# Patient Record
Sex: Female | Born: 1985 | Race: Black or African American | Hispanic: No | Marital: Single | Smoking: Never smoker
Health system: Southern US, Community
[De-identification: ages and names within clinical notes are randomized; demographics above are authoritative.]

## PROBLEM LIST (undated history)

## (undated) DIAGNOSIS — T7840XA Allergy, unspecified, initial encounter: Secondary | ICD-10-CM

## (undated) DIAGNOSIS — Z789 Other specified health status: Secondary | ICD-10-CM

## (undated) HISTORY — DX: Allergy, unspecified, initial encounter: T78.40XA

## (undated) HISTORY — PX: NO PAST SURGERIES: SHX2092

---
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
## 2008-04-13 ENCOUNTER — Inpatient Hospital Stay (HOSPITAL_COMMUNITY): Admission: AD | Admit: 2008-04-13 | Discharge: 2008-04-14 | Payer: Self-pay | Admitting: Obstetrics and Gynecology

## 2008-04-14 ENCOUNTER — Encounter: Payer: Self-pay | Admitting: Obstetrics & Gynecology

## 2008-12-21 ENCOUNTER — Inpatient Hospital Stay (HOSPITAL_COMMUNITY): Admission: AD | Admit: 2008-12-21 | Discharge: 2008-12-22 | Payer: Self-pay | Admitting: Obstetrics & Gynecology

## 2009-03-06 IMAGING — US US TRANSVAGINAL NON-OB
1 series · 14 of 25 positions shown · non-contrast
Comparison: None

CLINICAL DATA: Abdominal pain

TRANSABDOMINAL AND TRANSVAGINAL PELVIC ULTRASOUND
TECHNIQUE: Both transabdominal and transvaginal ultrasound examinations of the
pelvis were performed including evaluation of the uterus, ovaries, adnexal
regions, and pelvic cul-de-sac.

[Series 1: us transvaginal non-ob · 0.24mm/px · 14 of 42 slices shown]
[im 1/42]
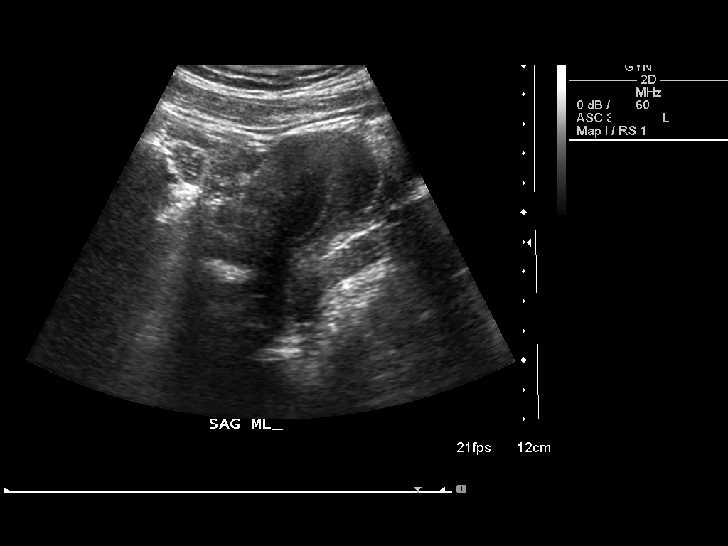
[im 4/42]
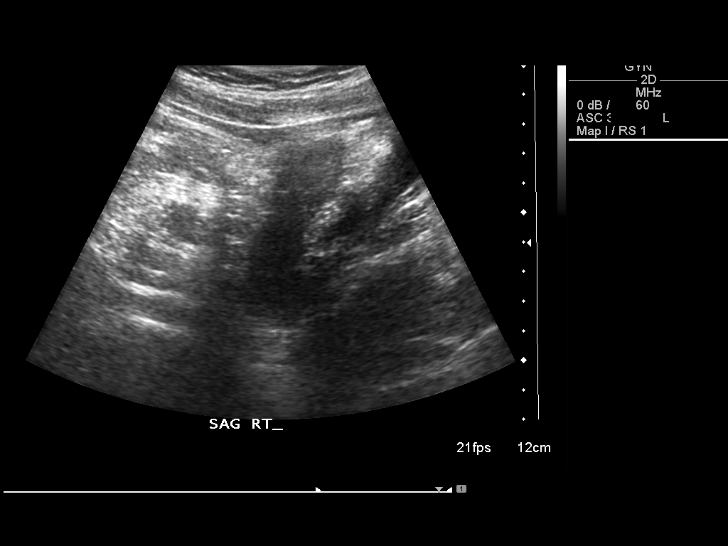
[im 7/42]
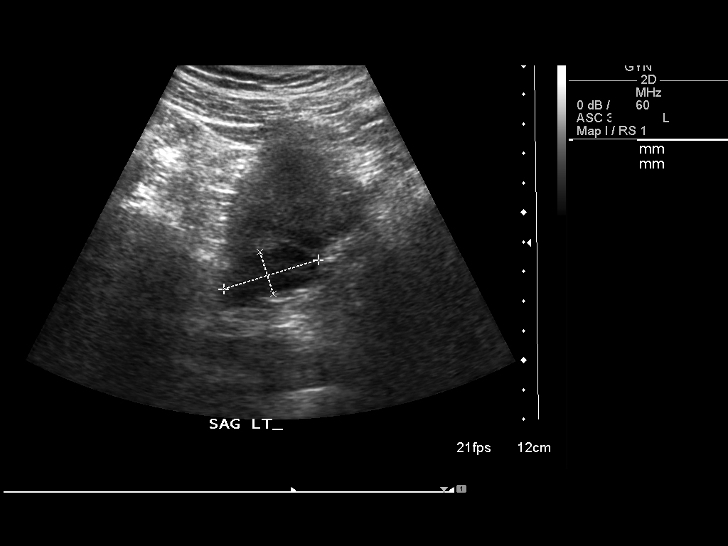
[im 11/42]
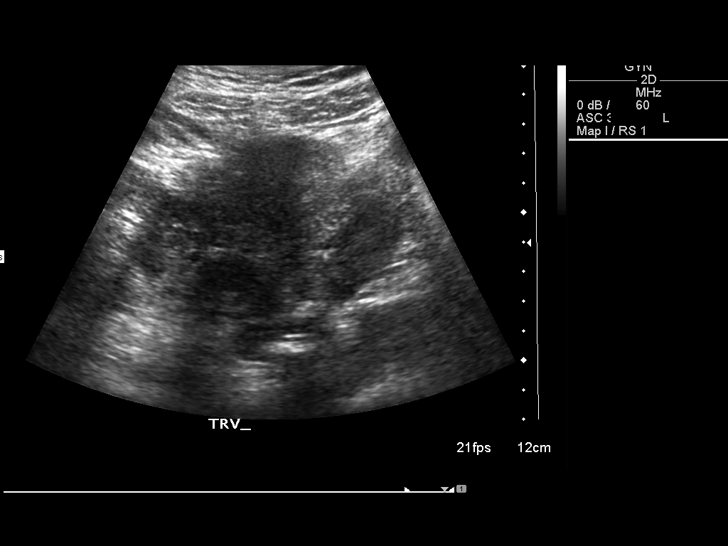
[im 14/42]
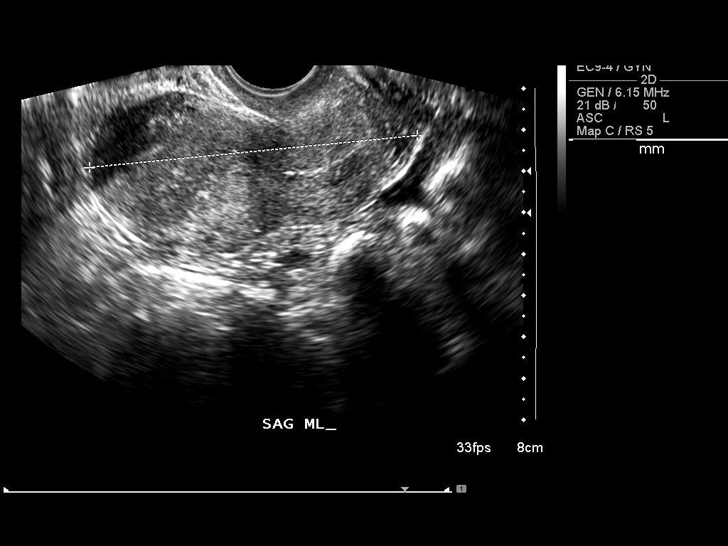
[im 16/42]
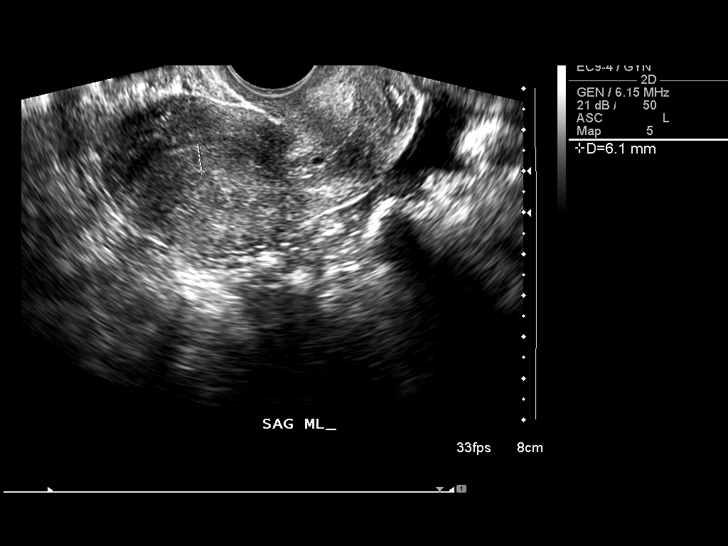
[im 19/42]
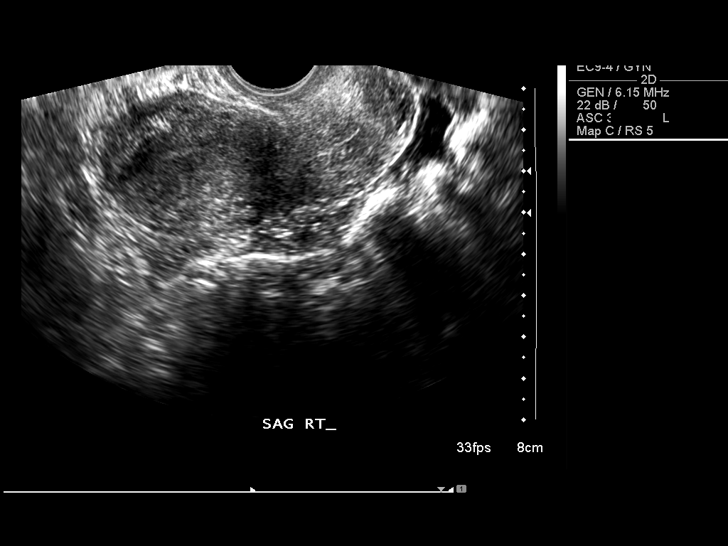
[im 23/42]
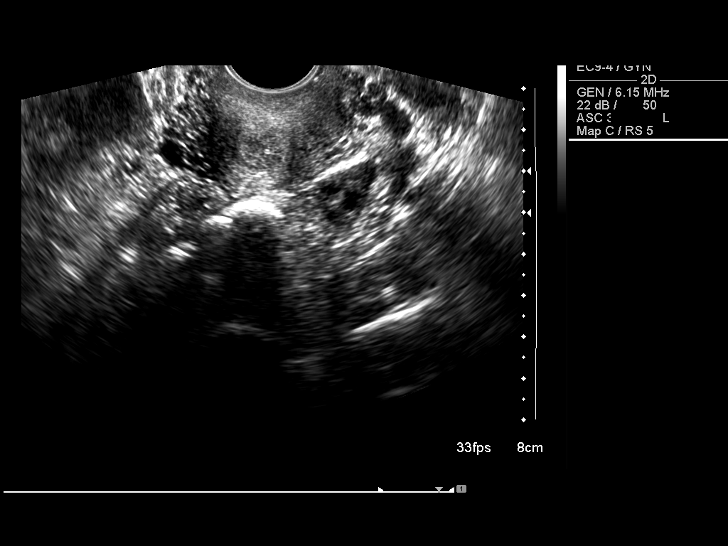
[im 26/42]
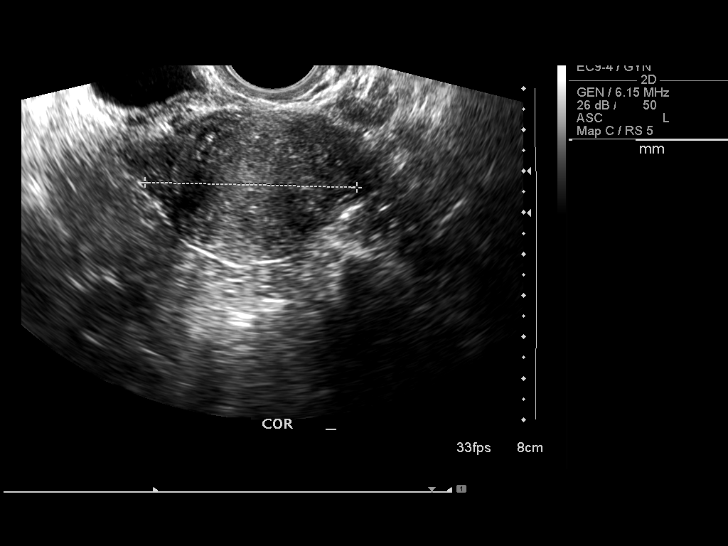
[im 28/42]
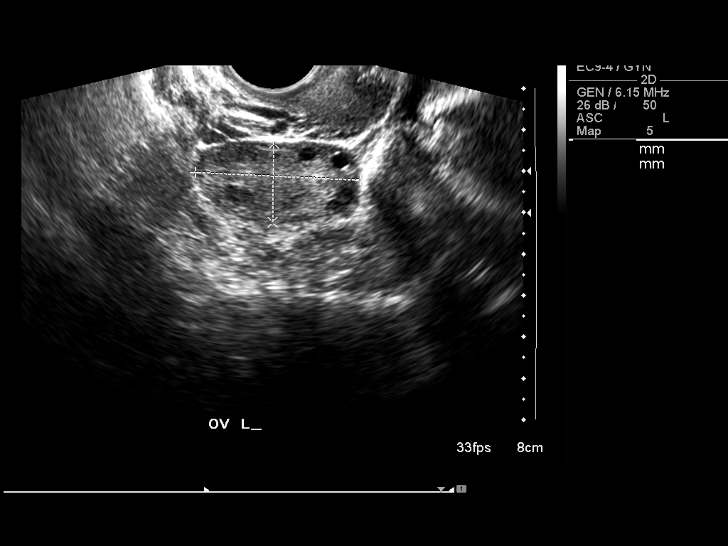
[im 31/42]
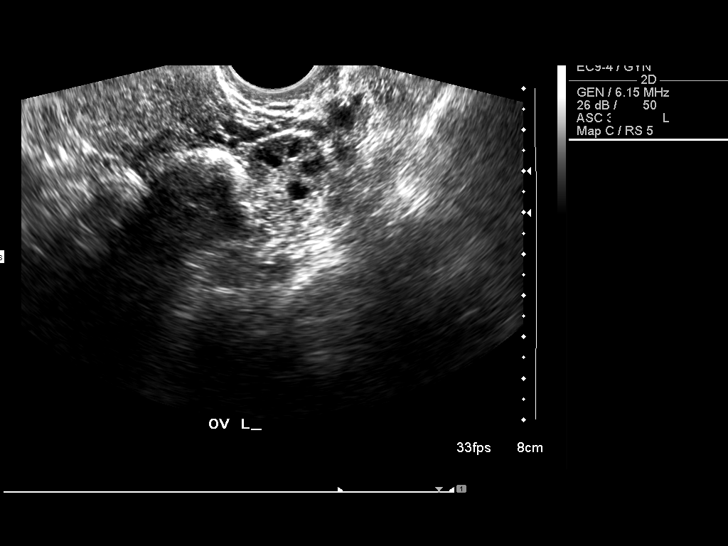
[im 35/42]
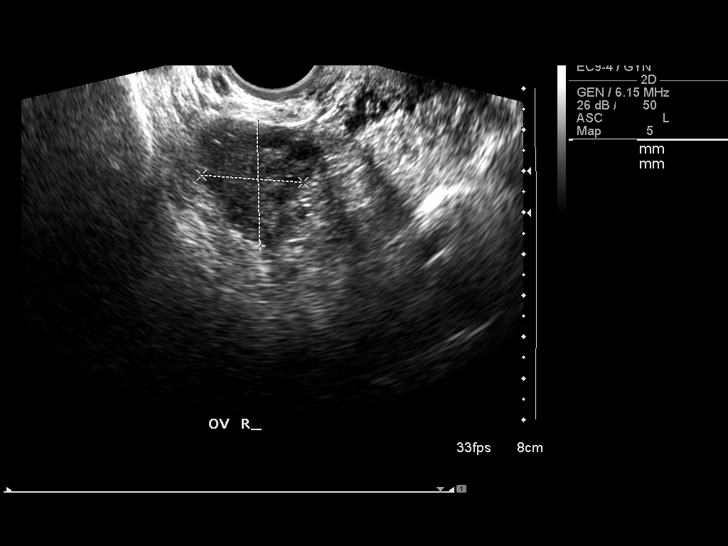
[im 38/42]
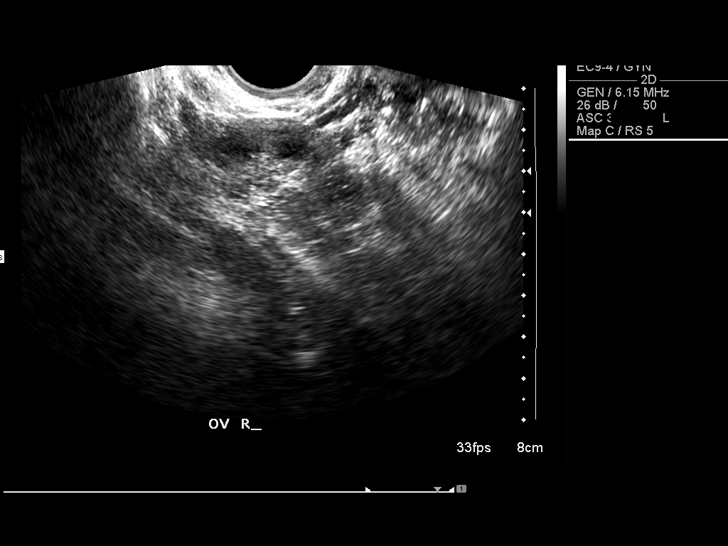
[im 42/42]
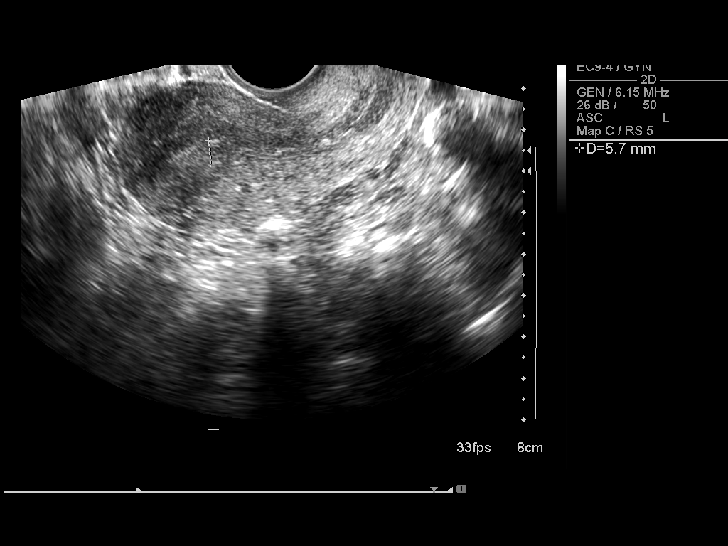

[14 of 25 positions shown; findings below may reference images not displayed]

FINDINGS: Uterine length is 7.9 cm. There is a small amount of free pelvic fluid
in the cul-de-sac.

Left ovary measures 4.0 x 1.9 x 2.0 cm and contains normal appearing follicles.
The right ovary measures 3.0 x 2.5 x 2.7 cm and likewise contains normal
appearing follicles.

The endometrial stripe measures 6 mm in thickness. The cervix appears normal.

IMPRESSION

1. Small amount of free pelvic fluid in the cul-de-sac. The uterus and ovaries
appear normal.

## 2009-06-19 ENCOUNTER — Emergency Department (HOSPITAL_COMMUNITY): Admission: EM | Admit: 2009-06-19 | Discharge: 2009-06-19 | Payer: Self-pay | Admitting: Family Medicine

## 2010-06-26 LAB — WET PREP, GENITAL: Trich, Wet Prep: NONE SEEN

## 2010-06-26 LAB — URINE MICROSCOPIC-ADD ON

## 2010-06-26 LAB — GC/CHLAMYDIA PROBE AMP, GENITAL: GC Probe Amp, Genital: NEGATIVE

## 2010-06-26 LAB — URINALYSIS, ROUTINE W REFLEX MICROSCOPIC
Bilirubin Urine: NEGATIVE
Glucose, UA: NEGATIVE mg/dL
Ketones, ur: NEGATIVE mg/dL
pH: 8.5 — ABNORMAL HIGH (ref 5.0–8.0)

## 2010-07-04 IMAGING — US US OB COMP LESS 14 WK
1 series · 13 of 27 positions shown · non-contrast
Comparison: None applicable.

CLINICAL DATA: 22-year-old female with cramping and heavy vaginal
bleeding.  Quantitative beta HCG of 2174.

OBSTETRIC <14 WK ULTRASOUND
TECHNIQUE: Transabdominal ultrasound was performed for evaluation
of the gestation as well as the maternal uterus and adnexal
regions.

[Series 1: us ob comp less 14 wks · 13 of 27 slices shown]
[im 2/27]
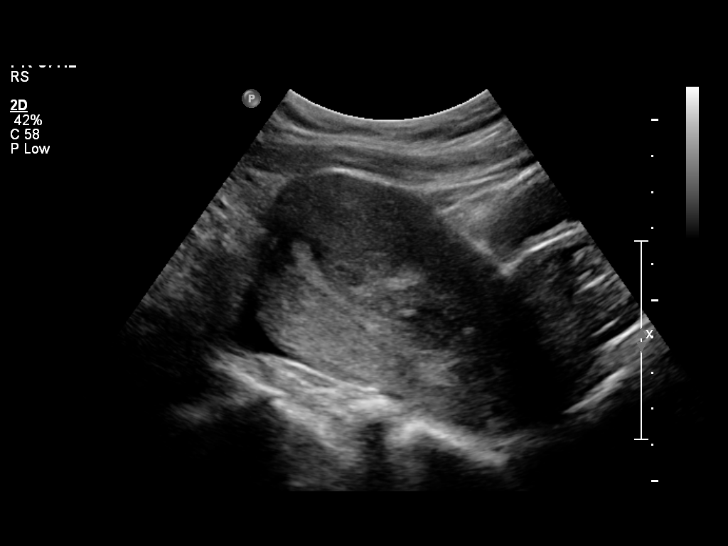
[im 4/27]
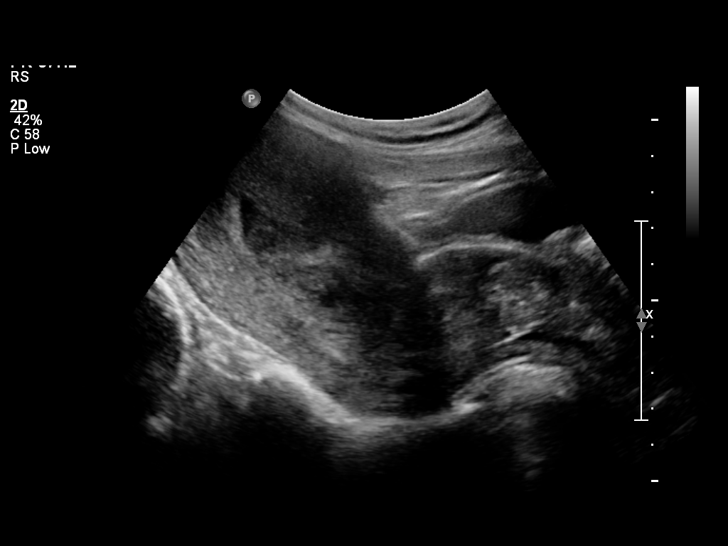
[im 6/27]
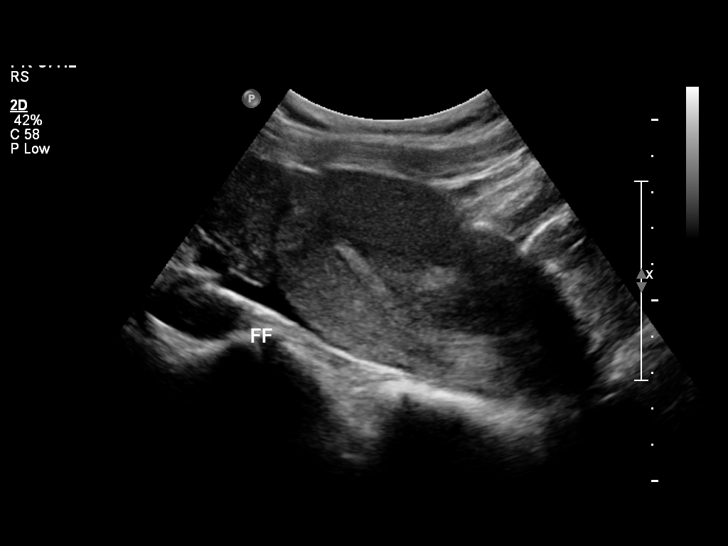
[im 8/27]
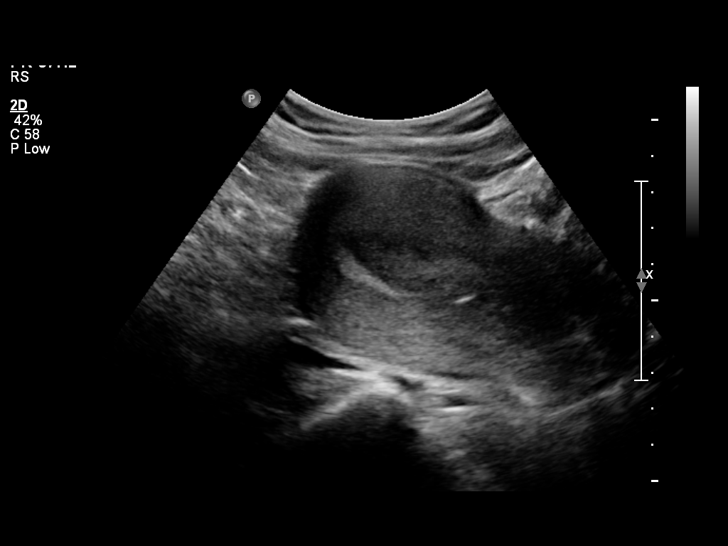
[im 10/27]
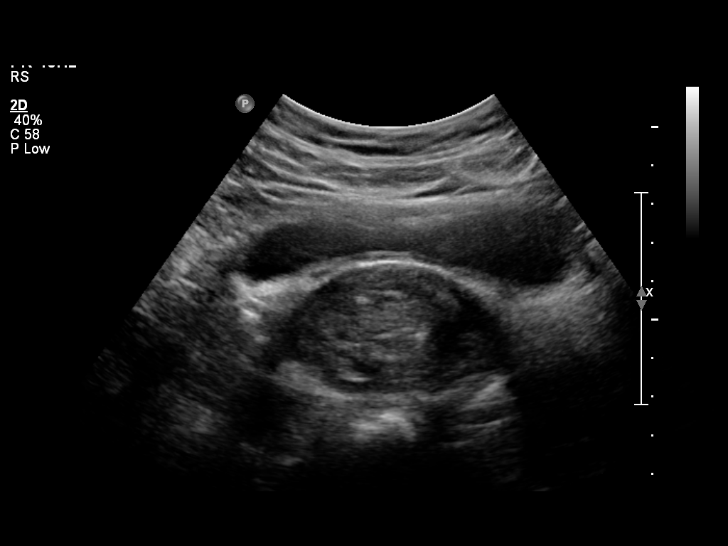
[im 12/27]
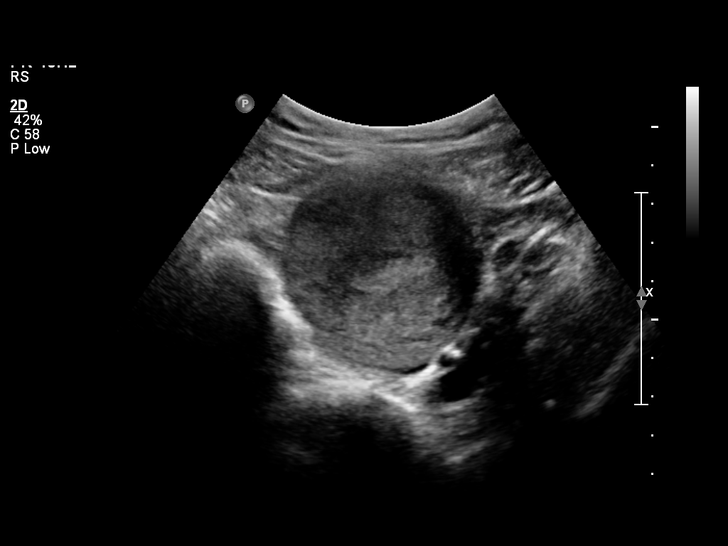
[im 14/27]
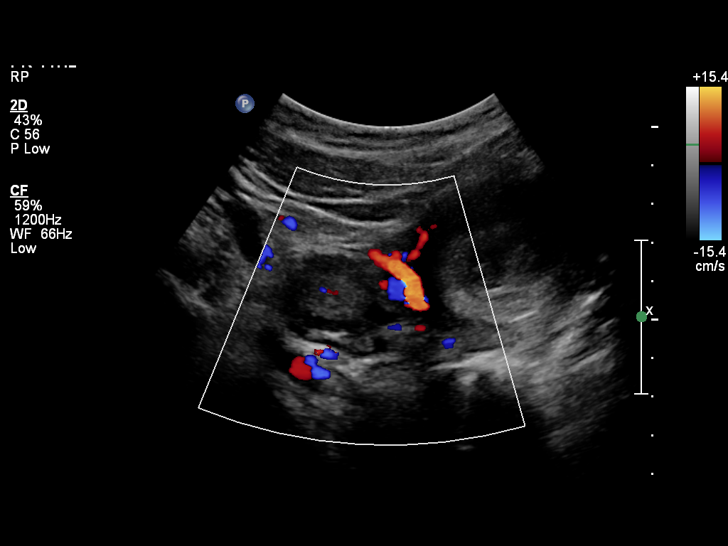
[im 16/27]
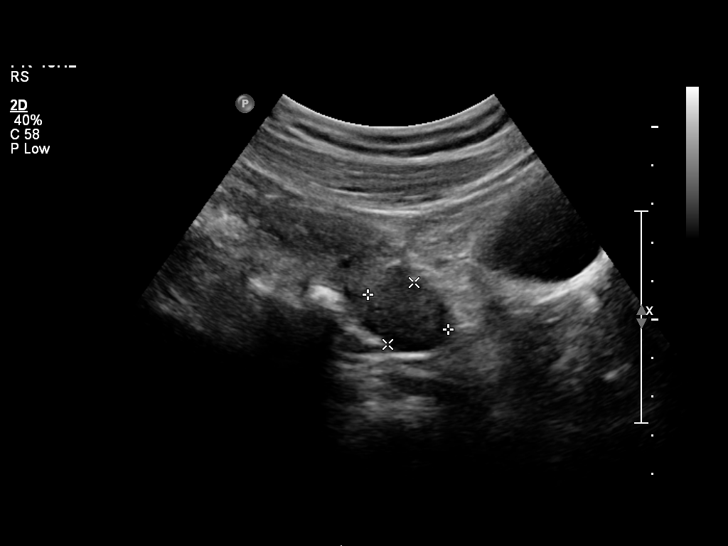
[im 18/27]
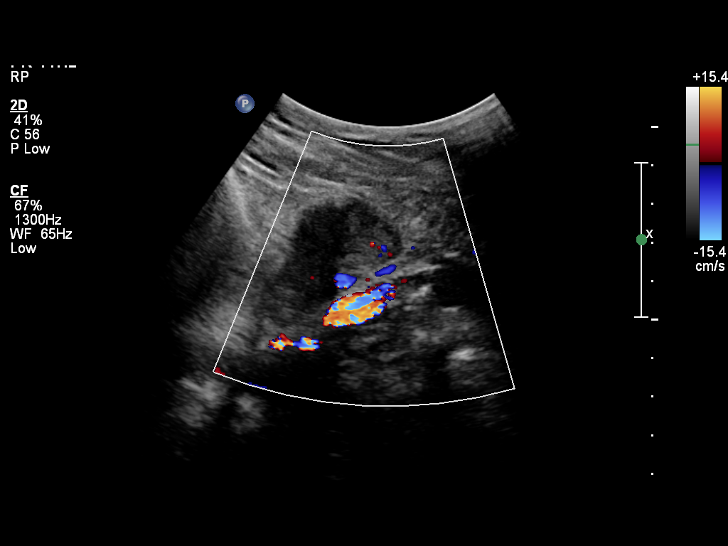
[im 20/27]
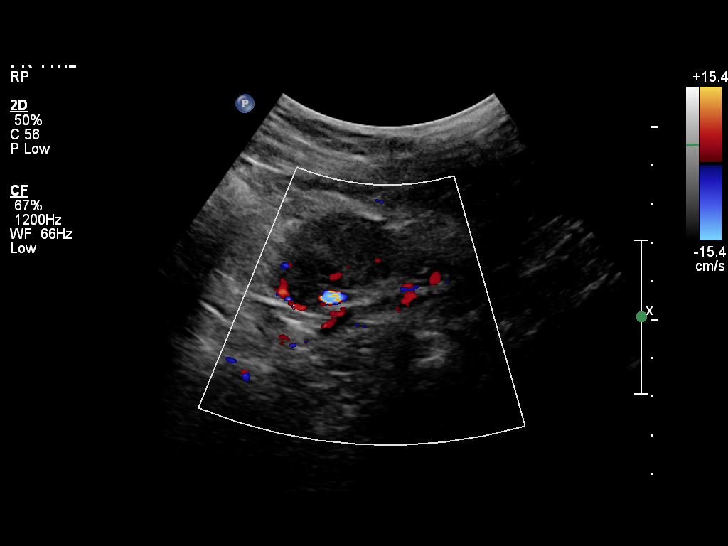
[im 22/27]
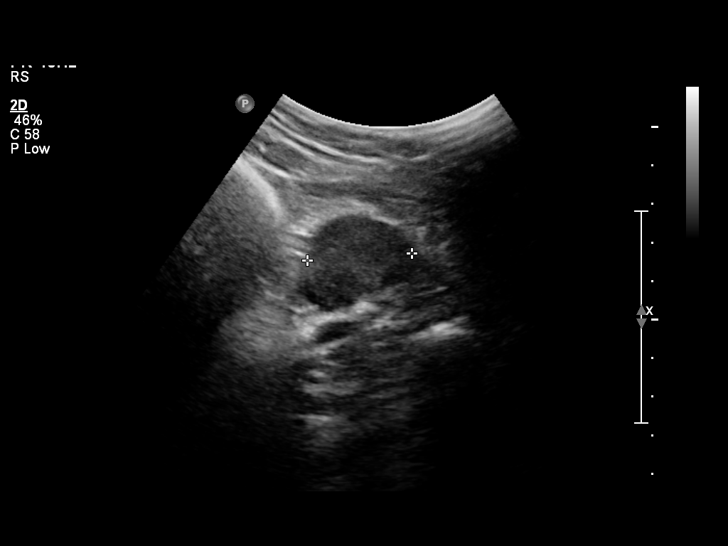
[im 24/27]
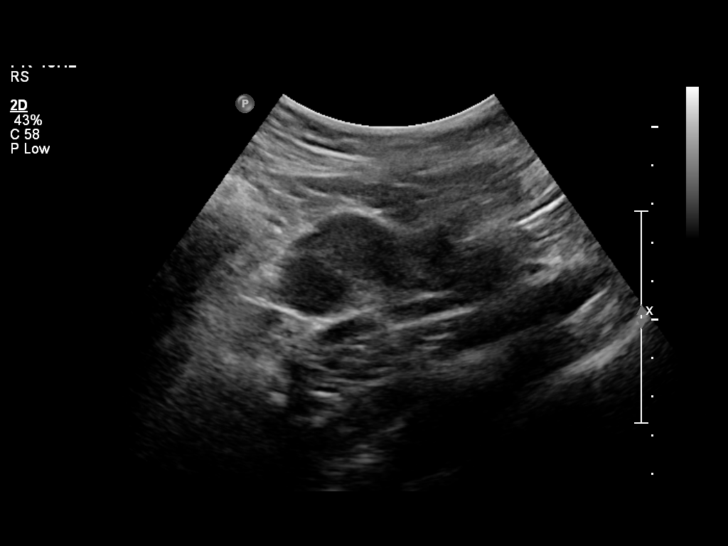
[im 26/27]
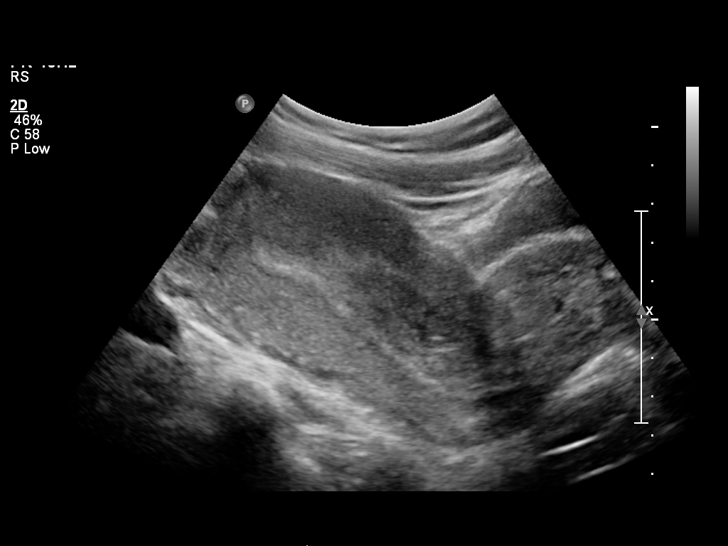

[13 of 27 positions shown; findings below may reference images not displayed]

FINDINGS: The uterine endometrial canal is distended by complex,
heterogeneous echogenicity material measuring up to 20 mm in
thickness.  This continues to the level of the cervix.  No
intrauterine gestational sac is identified. There is a trace volume
of simple appearing pelvic free fluid adjacent to the left adnexa.
The right ovary is normal measuring 2.2 x 2.3 x 1.7 cm.  No right
adnexal mass.  The left ovary is slightly larger measuring 2.7 x
3.0 x 1.9 cm and contains a 10 x 14 mm hypoechoic lesion which is
favored to represent a corpus luteal cyst.  No internal vascularity
is seen within the lesion, and the surrounding vascularity appears
within normal limits.  No other left adnexal mass.
IMPRESSION: Trace volume of simple appearing pelvic free fluid.  Complex
material distending the uterine endometrial canal and continuing to
the level of the cervix, probably hemorrhage.  Left ovary, probable
corpus luteum cyst.  No normal intrauterine pregnancy identified.
Favor spontaneous abortion in progress, but cannot exclude an
occult ectopic pregnancy. Recommend correlation with serial
quantitative BHCG and followup imaging.

## 2010-07-07 LAB — ABO/RH: ABO/RH(D): A POS

## 2010-07-07 LAB — CBC
HCT: 30.8 % — ABNORMAL LOW (ref 36.0–46.0)
MCV: 93.5 fL (ref 78.0–100.0)
RBC: 3.3 MIL/uL — ABNORMAL LOW (ref 3.87–5.11)
WBC: 8.5 10*3/uL (ref 4.0–10.5)

## 2010-07-07 LAB — GC/CHLAMYDIA PROBE AMP, GENITAL: GC Probe Amp, Genital: POSITIVE — AB

## 2010-07-07 LAB — HCG, QUANTITATIVE, PREGNANCY: hCG, Beta Chain, Quant, S: 4258 m[IU]/mL — ABNORMAL HIGH (ref <5)

## 2010-10-19 ENCOUNTER — Emergency Department (HOSPITAL_COMMUNITY)
Admission: EM | Admit: 2010-10-19 | Discharge: 2010-10-19 | Disposition: A | Discharge status: Home / Self Care | Payer: Self-pay | Attending: Emergency Medicine | Admitting: Emergency Medicine

## 2010-10-19 DIAGNOSIS — B373 Candidiasis of vulva and vagina: Secondary | ICD-10-CM | POA: Insufficient documentation

## 2010-10-19 DIAGNOSIS — B3731 Acute candidiasis of vulva and vagina: Secondary | ICD-10-CM | POA: Insufficient documentation

## 2010-10-19 DIAGNOSIS — N898 Other specified noninflammatory disorders of vagina: Secondary | ICD-10-CM | POA: Insufficient documentation

## 2010-10-19 DIAGNOSIS — L02419 Cutaneous abscess of limb, unspecified: Secondary | ICD-10-CM | POA: Insufficient documentation

## 2010-10-19 LAB — URINALYSIS, ROUTINE W REFLEX MICROSCOPIC
Hgb urine dipstick: NEGATIVE
Leukocytes, UA: NEGATIVE
Nitrite: NEGATIVE
Specific Gravity, Urine: 1.029 (ref 1.005–1.030)
Urobilinogen, UA: 1 mg/dL (ref 0.0–1.0)

## 2010-10-19 LAB — WET PREP, GENITAL: Trich, Wet Prep: NONE SEEN

## 2010-10-21 LAB — GC/CHLAMYDIA PROBE AMP, GENITAL: GC Probe Amp, Genital: NEGATIVE

## 2011-01-01 ENCOUNTER — Emergency Department (HOSPITAL_COMMUNITY)
Admission: EM | Admit: 2011-01-01 | Discharge: 2011-01-01 | Disposition: A | Discharge status: Home / Self Care | Payer: Self-pay | Attending: Emergency Medicine | Admitting: Emergency Medicine

## 2011-01-01 DIAGNOSIS — L259 Unspecified contact dermatitis, unspecified cause: Secondary | ICD-10-CM | POA: Insufficient documentation

## 2011-01-01 DIAGNOSIS — L299 Pruritus, unspecified: Secondary | ICD-10-CM | POA: Insufficient documentation

## 2011-01-01 LAB — URINALYSIS, ROUTINE W REFLEX MICROSCOPIC
Glucose, UA: NEGATIVE
Nitrite: NEGATIVE
Specific Gravity, Urine: 1.025
pH: 6

## 2011-01-01 LAB — URINE MICROSCOPIC-ADD ON: RBC / HPF: NONE SEEN

## 2011-01-01 LAB — CBC
MCHC: 34.3
Platelets: 155
RDW: 12.8

## 2011-01-01 LAB — POCT PREGNANCY, URINE: Operator id: 181051

## 2011-01-01 LAB — WET PREP, GENITAL: Trich, Wet Prep: NONE SEEN

## 2011-01-01 LAB — GC/CHLAMYDIA PROBE AMP, GENITAL: Chlamydia, DNA Probe: NEGATIVE

## 2011-11-23 ENCOUNTER — Encounter (HOSPITAL_COMMUNITY): Payer: Self-pay | Admitting: *Deleted

## 2011-11-23 ENCOUNTER — Inpatient Hospital Stay (HOSPITAL_COMMUNITY)
Admission: AD | Admit: 2011-11-23 | Discharge: 2011-11-23 | Disposition: A | Discharge status: Home / Self Care | Payer: Medicaid Other | Source: Ambulatory Visit | Attending: Obstetrics & Gynecology | Admitting: Obstetrics & Gynecology

## 2011-11-23 DIAGNOSIS — N926 Irregular menstruation, unspecified: Secondary | ICD-10-CM

## 2011-11-23 DIAGNOSIS — M545 Low back pain, unspecified: Secondary | ICD-10-CM | POA: Insufficient documentation

## 2011-11-23 DIAGNOSIS — R109 Unspecified abdominal pain: Secondary | ICD-10-CM | POA: Insufficient documentation

## 2011-11-23 DIAGNOSIS — N912 Amenorrhea, unspecified: Secondary | ICD-10-CM | POA: Insufficient documentation

## 2011-11-23 HISTORY — DX: Other specified health status: Z78.9

## 2011-11-23 LAB — URINE MICROSCOPIC-ADD ON

## 2011-11-23 LAB — CBC
HCT: 37.1 % (ref 36.0–46.0)
Hemoglobin: 12 g/dL (ref 12.0–15.0)
RBC: 4.02 MIL/uL (ref 3.87–5.11)
RDW: 13.4 % (ref 11.5–15.5)
WBC: 3.6 10*3/uL — ABNORMAL LOW (ref 4.0–10.5)

## 2011-11-23 LAB — URINALYSIS, ROUTINE W REFLEX MICROSCOPIC
Bilirubin Urine: NEGATIVE
Glucose, UA: NEGATIVE mg/dL
Ketones, ur: NEGATIVE mg/dL
Leukocytes, UA: NEGATIVE
Nitrite: NEGATIVE
Specific Gravity, Urine: 1.015 (ref 1.005–1.030)
pH: 8 (ref 5.0–8.0)

## 2011-11-23 NOTE — MAU Note (Signed)
Breast tenderness & back ache x 1 1/2 weeks.

## 2011-11-23 NOTE — MAU Provider Note (Signed)
Chief Complaint: Possible Pregnancy and Vaginal Bleeding   First Provider Initiated Contact with Patient 11/23/11 1336     SUBJECTIVE HPI: Makayla Johnson is a 26 y.o. G1P0010 6+ weeks from LMP who presents with vaginal spotting for a few days, today like a heavy period. Also have mild cramps and low back pain. Has not had late menses before. Overdue for Pap and wants to start contraception. Has gotten [facility] now so will get GYN appointment. Wants GC/CT. No irritative discharge or new partner. No weight gain, depression. Borderline anemia in past and some heavy menses. No orthostatic sx.   Past Medical History  Diagnosis Date  . No pertinent past medical history    OB History    Grav Para Term Preterm Abortions TAB SAB Ect Mult Living   1    1  1         # Outc Date GA Lbr Len/2nd Wgt Sex Del Anes PTL Lv   1 SAB              Past Surgical History  Procedure Date  . No past surgeries    History   Social History  . Marital Status: Single    Spouse Name: N/A    Number of Children: N/A  . Years of Education: N/A   Occupational History  . Not on file.   Social History Main Topics  . Smoking status: Never Smoker   . Smokeless tobacco: Not on file  . Alcohol Use: No  . Drug Use: No  . Sexually Active: Yes    Birth Control/ Protection: Condom   Other Topics Concern  . Not on file   Social History Narrative  . No narrative on file   No current facility-administered medications on file prior to encounter.   No current outpatient prescriptions on file prior to encounter.   Allergies not on file  ROS: Pertinent items in HPI  OBJECTIVE Blood pressure 122/66, pulse 100, temperature 98.2 F (36.8 C), temperature source Oral, resp. rate 16, height 5\' 3"  (1.6 m), weight 81.557 kg (179 lb 12.8 oz), last menstrual period 10/08/2011, SpO2 100.00%. GENERAL: Well-developed, well-nourished female in no acute distress.  HEENT: Normocephalic HEART: normal rate RESP: normal  effort ABDOMEN: Soft, non-tender EXTREMITIES: Nontender, no edema NEURO: Alert and oriented SPECULUM EXAM: NEFG, no discharge, moderate to heavy blood flow, cervix poorly visualized BIMANUAL: cervix L/C, mobile without tenderness; uterus upper normal size, no adnexal tenderness or masses  LAB RESULTS Results for orders placed during the hospital encounter of 11/23/11 (from the past 24 hour(s))  URINALYSIS, ROUTINE W REFLEX MICROSCOPIC     Status: Abnormal   Collection Time   11/23/11 12:05 PM      Component Value Range   Color, Urine RED (*) YELLOW   APPearance HAZY (*) CLEAR   Specific Gravity, Urine 1.015  1.005 - 1.030   pH 8.0  5.0 - 8.0   Glucose, UA NEGATIVE  NEGATIVE mg/dL   Hgb urine dipstick LARGE (*) NEGATIVE   Bilirubin Urine NEGATIVE  NEGATIVE   Ketones, ur NEGATIVE  NEGATIVE mg/dL   Protein, ur NEGATIVE  NEGATIVE mg/dL   Urobilinogen, UA 0.2  0.0 - 1.0 mg/dL   Nitrite NEGATIVE  NEGATIVE   Leukocytes, UA NEGATIVE  NEGATIVE  URINE MICROSCOPIC-ADD ON     Status: Normal   Collection Time   11/23/11 12:05 PM      Component Value Range   Squamous Epithelial / LPF RARE  RARE  WBC, UA 0-2  <3 WBC/hpf   RBC / HPF TOO NUMEROUS TO COUNT  <3 RBC/hpf  POCT PREGNANCY, URINE     Status: Normal   Collection Time   11/23/11 12:15 PM      Component Value Range   Preg Test, Ur NEGATIVE  NEGATIVE  CBC     Status: Abnormal   Collection Time   11/23/11  2:15 PM      Component Value Range   WBC 3.6 (*) 4.0 - 10.5 K/uL   RBC 4.02  3.87 - 5.11 MIL/uL   Hemoglobin 12.0  12.0 - 15.0 g/dL   HCT 16.1  09.6 - 04.5 %   MCV 92.3  78.0 - 100.0 fL   MCH 29.9  26.0 - 34.0 pg   MCHC 32.3  30.0 - 36.0 g/dL   RDW 40.9  81.1 - 91.4 %   Platelets 170  150 - 400 K/uL    IMAGING No results found.  ASSESSMENT 1. Late menses     ED COURSE  PLAN GC/CT sent Discharge home Medication List    Notice       You have not been prescribed any medications.           keep bleeding calendar.  Call for appointment with provider of choice from list given.   Danae Orleans, CNM 11/23/2011  1:38 PM

## 2011-11-23 NOTE — MAU Note (Signed)
Patient states she has had spotting for several days, this am a little heavier. Some lower abdominal pressure. Patient is wearing a full size pad that has a coating of bright red bleeding over 1/2 of the pad. Patient given a new pad in triage.

## 2011-11-24 LAB — GC/CHLAMYDIA PROBE AMP, GENITAL: Chlamydia, DNA Probe: NEGATIVE

## 2012-10-03 ENCOUNTER — Encounter (HOSPITAL_COMMUNITY): Payer: Self-pay | Admitting: Emergency Medicine

## 2012-10-03 DIAGNOSIS — N949 Unspecified condition associated with female genital organs and menstrual cycle: Secondary | ICD-10-CM | POA: Insufficient documentation

## 2012-10-03 DIAGNOSIS — N938 Other specified abnormal uterine and vaginal bleeding: Secondary | ICD-10-CM | POA: Insufficient documentation

## 2012-10-03 DIAGNOSIS — Z3202 Encounter for pregnancy test, result negative: Secondary | ICD-10-CM | POA: Insufficient documentation

## 2012-10-03 LAB — CBC WITH DIFFERENTIAL/PLATELET
Eosinophils Relative: 1 % (ref 0–5)
Lymphocytes Relative: 41 % (ref 12–46)
Lymphs Abs: 2 10*3/uL (ref 0.7–4.0)
MCV: 89.9 fL (ref 78.0–100.0)
Platelets: 169 10*3/uL (ref 150–400)
RBC: 4.35 MIL/uL (ref 3.87–5.11)
WBC: 4.9 10*3/uL (ref 4.0–10.5)

## 2012-10-03 LAB — COMPREHENSIVE METABOLIC PANEL
ALT: 9 U/L (ref 0–35)
Alkaline Phosphatase: 54 U/L (ref 39–117)
CO2: 26 mEq/L (ref 19–32)
Calcium: 9.5 mg/dL (ref 8.4–10.5)
GFR calc Af Amer: >90 mL/min (ref >90)
GFR calc non Af Amer: >90 mL/min (ref >90)
Glucose, Bld: 79 mg/dL (ref 70–99)
Potassium: 3.8 mEq/L (ref 3.5–5.1)
Sodium: 138 mEq/L (ref 135–145)
Total Bilirubin: 0.3 mg/dL (ref 0.3–1.2)

## 2012-10-03 NOTE — ED Notes (Signed)
PT. REPORTS VAGINAL SPOTTING WITH MILD LOW ABDOMINAL PRESSURE/CRAMPING ONSET TODAY , PT. STATED POSITIVE HOME PREG TEST 2 DAYS AGO , G2P0.

## 2012-10-04 ENCOUNTER — Emergency Department (HOSPITAL_COMMUNITY)
Admission: EM | Admit: 2012-10-04 | Discharge: 2012-10-04 | Disposition: A | Discharge status: Home / Self Care | Payer: Medicaid Other | Attending: Emergency Medicine | Admitting: Emergency Medicine

## 2012-10-04 DIAGNOSIS — IMO0001 Reserved for inherently not codable concepts without codable children: Secondary | ICD-10-CM

## 2012-10-04 LAB — URINALYSIS, ROUTINE W REFLEX MICROSCOPIC
Bilirubin Urine: NEGATIVE
Ketones, ur: NEGATIVE mg/dL
Nitrite: NEGATIVE
Urobilinogen, UA: 0.2 mg/dL (ref 0.0–1.0)

## 2012-10-04 NOTE — ED Provider Notes (Signed)
Medical screening examination/treatment/procedure(s) were performed by non-physician practitioner and as supervising physician I was immediately available for consultation/collaboration.   Shakil Dirk, MD 10/04/12 0606 

## 2012-10-04 NOTE — ED Provider Notes (Signed)
History    CSN: [id number] Arrival date & time 10/03/12  2252  First MD Initiated Contact with Patient 10/04/12 0236     Chief Complaint  Patient presents with  . Vaginal Bleeding   HPI  History provided by the patient. Patient is a 27 year old female who is a G1P0 who presents with concerns for missing her last normal menstrual period and reports a positive pregnancy test at home now having vaginal bleeding and spotting. Patient expected her last menstrual period over a week ago. She took a home pregnancy test 2 days ago which was positive. Patient is sexually active with one partner. Today she had some vaginal spotting and bleeding. She denies any associated abdominal pains to me. Does report occasional cramps. Denies any other associated symptoms. No fever, chills or sweats. No nausea vomiting. No urinary complaints. No other aggravating or alleviating factors.    Past Medical History  Diagnosis Date  . No pertinent past medical history    Past Surgical History  Procedure Laterality Date  . No past surgeries     No family history on file. History  Substance Use Topics  . Smoking status: Never Smoker   . Smokeless tobacco: Not on file  . Alcohol Use: No   OB History   Grav Para Term Preterm Abortions TAB SAB Ect Mult Living   1    1  1         Review of Systems  Constitutional: Negative for fever, chills and diaphoresis.  Respiratory: Negative for shortness of breath.   Cardiovascular: Negative for chest pain.  Gastrointestinal: Negative for nausea, vomiting, abdominal pain, diarrhea and constipation.  Genitourinary: Positive for vaginal bleeding and vaginal discharge. Negative for dysuria, frequency, hematuria and flank pain.  All other systems reviewed and are negative.    Allergies  Review of patient's allergies indicates no known allergies.  Home Medications   Current Outpatient Rx  Name  Route  Sig  Dispense  Refill  . Multiple Vitamin (MULTIVITAMIN WITH  MINERALS) TABS   Oral   Take 1 tablet by mouth every morning.          BP 137/78  Pulse 86  Temp(Src) 98.7 F (37.1 C) (Oral)  Resp 14  SpO2 100%  LMP 08/28/2012 Physical Exam  Nursing note and vitals reviewed. Constitutional: She is oriented to person, place, and time. She appears well-developed and well-nourished. No distress.  HENT:  Head: Normocephalic.  Cardiovascular: Normal rate and regular rhythm.   Pulmonary/Chest: Effort normal and breath sounds normal. No respiratory distress. She has no wheezes. She has no rales.  Abdominal: Soft. There is no tenderness. There is no rebound and no guarding.  Genitourinary:  Refused  Musculoskeletal: Normal range of motion.  Neurological: She is alert and oriented to person, place, and time.  Skin: Skin is warm and dry. No rash noted.  Psychiatric: She has a normal mood and affect. Her behavior is normal.    ED Course  Procedures   Results for orders placed during the hospital encounter of 10/04/12  HCG, QUANTITATIVE, PREGNANCY      Result Value Range   hCG, Beta Chain, Quant, S <1  <5 mIU/mL  CBC WITH DIFFERENTIAL      Result Value Range   WBC 4.9  4.0 - 10.5 K/uL   RBC 4.35  3.87 - 5.11 MIL/uL   Hemoglobin 12.9  12.0 - 15.0 g/dL   HCT 40.9  81.1 - 91.4 %   MCV 89.9  78.0 - 100.0 fL   MCH 29.7  26.0 - 34.0 pg   MCHC 33.0  30.0 - 36.0 g/dL   RDW 96.2  95.2 - 84.1 %   Platelets 169  150 - 400 K/uL   Neutrophils Relative % 51  43 - 77 %   Neutro Abs 2.5  1.7 - 7.7 K/uL   Lymphocytes Relative 41  12 - 46 %   Lymphs Abs 2.0  0.7 - 4.0 K/uL   Monocytes Relative 7  3 - 12 %   Monocytes Absolute 0.3  0.1 - 1.0 K/uL   Eosinophils Relative 1  0 - 5 %   Eosinophils Absolute 0.1  0.0 - 0.7 K/uL   Basophils Relative 0  0 - 1 %   Basophils Absolute 0.0  0.0 - 0.1 K/uL  COMPREHENSIVE METABOLIC PANEL      Result Value Range   Sodium 138  135 - 145 mEq/L   Potassium 3.8  3.5 - 5.1 mEq/L   Chloride 103  96 - 112 mEq/L   CO2 26   19 - 32 mEq/L   Glucose, Bld 79  70 - 99 mg/dL   BUN 9  6 - 23 mg/dL   Creatinine, Ser 3.24  0.50 - 1.10 mg/dL   Calcium 9.5  8.4 - 40.1 mg/dL   Total Protein 7.5  6.0 - 8.3 g/dL   Albumin 3.9  3.5 - 5.2 g/dL   AST 15  0 - 37 U/L   ALT 9  0 - 35 U/L   Alkaline Phosphatase 54  39 - 117 U/L   Total Bilirubin 0.3  0.3 - 1.2 mg/dL   GFR calc non Af Amer >90  >90 mL/min   GFR calc Af Amer >90  >90 mL/min  URINALYSIS, ROUTINE W REFLEX MICROSCOPIC      Result Value Range   Color, Urine YELLOW  YELLOW   APPearance CLOUDY (*) CLEAR   Specific Gravity, Urine 1.018  1.005 - 1.030   pH 6.0  5.0 - 8.0   Glucose, UA NEGATIVE  NEGATIVE mg/dL   Hgb urine dipstick LARGE (*) NEGATIVE   Bilirubin Urine NEGATIVE  NEGATIVE   Ketones, ur NEGATIVE  NEGATIVE mg/dL   Protein, ur NEGATIVE  NEGATIVE mg/dL   Urobilinogen, UA 0.2  0.0 - 1.0 mg/dL   Nitrite NEGATIVE  NEGATIVE   Leukocytes, UA TRACE (*) NEGATIVE  URINE MICROSCOPIC-ADD ON      Result Value Range   Squamous Epithelial / LPF RARE  RARE   WBC, UA 0-2  <3 WBC/hpf   RBC / HPF 21-50  <3 RBC/hpf   Bacteria, UA RARE  RARE  POCT PREGNANCY, URINE      Result Value Range   Preg Test, Ur NEGATIVE  NEGATIVE      1. Normal menstrual period     MDM  Patient seen and evaluated. Patient appears well in no acute distress.  Patient has a negative urine pregnancy test and negative hCG blood tests. Patient states she was primarily concerned about possible pregnancy given her late menstrual period. At this time her current bleeding is most likely related to her normal menstrual periods. I did discuss options for pelvic examination to evaluate for any infection. She did not wish to have a pelvic exam and refused. At this time she will be discharged home.  Angus Seller, PA-C 10/04/12 [phone]

## 2014-01-22 ENCOUNTER — Encounter (HOSPITAL_COMMUNITY): Payer: Self-pay | Admitting: Emergency Medicine

## 2014-04-15 ENCOUNTER — Emergency Department (HOSPITAL_COMMUNITY)
Admission: EM | Admit: 2014-04-15 | Discharge: 2014-04-15 | Disposition: A | Discharge status: Home / Self Care | Payer: Medicaid Other | Attending: Emergency Medicine | Admitting: Emergency Medicine

## 2014-04-15 ENCOUNTER — Encounter (HOSPITAL_COMMUNITY): Payer: Self-pay | Admitting: Emergency Medicine

## 2014-04-15 DIAGNOSIS — K047 Periapical abscess without sinus: Secondary | ICD-10-CM

## 2014-04-15 DIAGNOSIS — Z79899 Other long term (current) drug therapy: Secondary | ICD-10-CM | POA: Insufficient documentation

## 2014-04-15 MED ORDER — HYDROCODONE-ACETAMINOPHEN 5-325 MG PO TABS
1.0000 | ORAL_TABLET | Freq: Once | ORAL | Status: AC
Start: 2014-04-15 — End: 2014-04-15
  Administered 2014-04-15: 1 via ORAL
  Filled 2014-04-15: qty 1

## 2014-04-15 MED ORDER — HYDROCODONE-ACETAMINOPHEN 5-325 MG PO TABS: 1.0000 | ORAL_TABLET | ORAL | Status: DC | PRN

## 2014-04-15 MED ORDER — PENICILLIN V POTASSIUM 500 MG PO TABS: 500.0000 mg | ORAL_TABLET | Freq: Four times a day (QID) | ORAL | Status: AC

## 2014-04-15 NOTE — ED Provider Notes (Signed)
CSN: [id number]     Arrival date & time 04/15/14  2222 History  This chart was scribed for non-physician practitioner, Makayla SitesLisa Daquann Merriott, PA-C working with Makayla CookeyMegan Docherty, MD by Makayla Johnson, ED scribe. This patient was seen in room TR05C/TR05C and the patient's care was started at 10:32 PM.   Chief Complaint  Patient presents with  . Dental Pain   The history is provided by the patient. No language interpreter was used.    HPI Comments: Makayla Johnson is a 29 y.o. female who presents to the Emergency Department complaining of worsening left lower dental pain with associated facial swelling that started 2 days ago days ago. States her tooth is broken. Pt has used tylenol 500 mg and Orajel with no relief. Denies fever, chills. No difficulty swallowing or shortness of breath. She does not have a Education officer, communitydentist.   Past Medical History  Diagnosis Date  . No pertinent past medical history    Past Surgical History  Procedure Laterality Date  . No past surgeries     No family history on file. History  Substance Use Topics  . Smoking status: Never Smoker   . Smokeless tobacco: Not on file  . Alcohol Use: No   OB History    Gravida Para Term Preterm AB TAB SAB Ectopic Multiple Living   1    1  1         Review of Systems  Constitutional: Negative for fever and chills.  HENT: Positive for dental problem and facial swelling.   All other systems reviewed and are negative.  Allergies  Review of patient's allergies indicates no known allergies.  Home Medications   Prior to Admission medications   Medication Sig Start Date End Date Taking? Authorizing Provider  Multiple Vitamin (MULTIVITAMIN WITH MINERALS) TABS Take 1 tablet by mouth every morning.    Historical Provider, MD   BP 142/90 mmHg  Pulse 93  Temp(Src) 98.6 F (37 C) (Oral)  Resp 18  Ht 5\' 4"  (1.626 m)  Wt 198 lb (89.812 kg)  BMI 33.97 kg/m2  SpO2 100%  LMP 04/08/2014   Physical Exam  Constitutional: She is oriented to  person, place, and time. She appears well-developed and well-nourished. No distress.  HENT:  Head: Normocephalic and atraumatic.  Mouth/Throat: Uvula is midline, oropharynx is clear and moist and mucous membranes are normal. No oropharyngeal exudate, posterior oropharyngeal edema, posterior oropharyngeal erythema or tonsillar abscesses.    Teeth largely in poor dentition, left lower pre-molar broken, surrounding gingiva swollen with obvious dental abscess, handling secretions appropriately, no trismus; mild swelling of left cheek without extension into neck  Eyes: Conjunctivae and EOM are normal. Pupils are equal, round, and reactive to light.  Neck: Trachea normal, normal range of motion and phonation normal. Neck supple.  Normal phonation, no stridor  Cardiovascular: Normal rate, regular rhythm and normal heart sounds.   Pulmonary/Chest: Effort normal and breath sounds normal. No stridor. No respiratory distress. She has no wheezes.  Abdominal: Soft.  Musculoskeletal: Normal range of motion.  Neurological: She is alert and oriented to person, place, and time.  Skin: Skin is warm and dry. She is not diaphoretic.  Psychiatric: She has a normal mood and affect.  Nursing note and vitals reviewed.   ED Course  Procedures (including critical care time)  DIAGNOSTIC STUDIES: Oxygen Saturation is 100% on RA, normal by my interpretation.    COORDINATION OF CARE: 10:33 PM-Discussed treatment plan which includes an antibiotic and pain medication with pt at  bedside and pt agreed to plan. Will give pt dental referrals and advised her to follow up.   Labs Review Labs Reviewed - No data to display  Imaging Review No results found.   EKG Interpretation None      MDM   Final diagnoses:  Dental abscess   Dental pain with dental abscess. Patient afebrile, handling secretions well. No concern for Ludwig angina.  Patient started on antibiotics and pain medication.  Follow with dentist,  referrals and resources guide provided.  Discussed plan with patient, he/she acknowledged understanding and agreed with plan of care.  Return precautions given for new or worsening symptoms.  I personally performed the services described in this documentation, which was scribed in my presence. The recorded information has been reviewed and is accurate.  Makayla Hatchet, PA-C 04/15/14 1308  Makayla Cookey, MD 04/16/14 2151

## 2014-04-15 NOTE — ED Notes (Signed)
Pt. reports left lower molar pain/swelling for several days unrelieved by OTC pain medication .

## 2014-04-15 NOTE — Discharge Instructions (Signed)
Take the prescribed medication as directed. °Follow-up with dentist. °Return to the ED for new or worsening symptoms. ° ° °Emergency Department Resource Guide °1) Find a Doctor and Pay Out of Pocket °Although you won't have to find out who is covered by your insurance plan, it is a good idea to ask around and get recommendations. You will then need to call the office and see if the doctor you have chosen will accept you as a new patient and what types of options they offer for patients who are self-pay. Some doctors offer discounts or will set up payment plans for their patients who do not have insurance, but you will need to ask so you aren't surprised when you get to your appointment. ° °2) Contact Your Local Health Department °Not all health departments have doctors that can see patients for sick visits, but many do, so it is worth a call to see if yours does. If you don't know where your local health department is, you can check in your phone book. The CDC also has a tool to help you locate your state's health department, and many state websites also have listings of all of their local health departments. ° °3) Find a Walk-in Clinic °If your illness is not likely to be very severe or complicated, you may want to try a walk in clinic. These are popping up all over the country in pharmacies, drugstores, and shopping centers. They're usually staffed by nurse practitioners or physician assistants that have been trained to treat common illnesses and complaints. They're usually fairly quick and inexpensive. However, if you have serious medical issues or chronic medical problems, these are probably not your best option. ° °No Primary Care Doctor: °- Call Health Connect at  832-8000 - they can help you locate a primary care doctor that  accepts your insurance, provides certain services, etc. °- Physician Referral Service- 1-800-533-3463 ° °Chronic Pain Problems: °Organization         Address  Phone   Notes  °[facility]  ([phone]) [phone] Patients need to be referred by their primary care doctor.  ° °Medication Assistance: °Organization         Address  Phone   Notes  °[place] [facility] [address] °[place], [place] [address] [phone]) 641-8030 --Must be a resident of [place] °-- Must have NO insurance coverage whatsoever (no Medicaid/ Medicare, etc.) °-- The pt. MUST have a primary care doctor that directs their care regularly and follows them in the community °  °[facility]  (866) 331-1348   °[employer]  (888) 892-1162   ° °Agencies that provide inexpensive medical care: °Organization         Address  Phone   Notes  °[facility]  ([phone]) [phone]   °[facility]    ([phone]) 832-7272   °[facility] [address] °[place], [place] [address] ([phone]) [phone]   °[facility] [address], °[place] ([phone]) [phone]   °Planned Parenthood    (336) 373-0678   °[facility]    (336) 272-1050   °[facility] ° [address], [place] Phone:  (336) 832-4444, Fax:  (336) [phone] Hours of Operation:  9 am - 6 pm, M-F.  Also accepts Medicaid/Medicare and self-pay.  ° [facility] ° [address], [place] Phone: (336) [phone], Fax: (336) [phone]. Hours   of Operation:  8:30 am - 5:30 pm, M-F.  Also accepts Medicaid and self-pay.  [facility] [place] [address], [place] Phone: [phone]   [place], [place], [place] ([phone], Ext. [phone] Mondays & Thursdays: 7-9 AM.  First 15 patients are seen on a first come, first serve basis.    Ewa Gentry Providers:  Organization         Address  Phone   Notes  [facility] [address], [place] [phone] Also accepts self-pay patients.  [facility] [address], [place]  ([phone]   [place], [address], [place] [phone]   [facility] [address], [place] [phone]   Lucianne Lei [address], [place]   [phone] Only accepts [place] Access [place] patients after they have their name applied to their card.   Self-Pay (no insurance) in [facility]:  Organization         Address  Phone   Notes  Sickle Cell Patients, [facility] [place] [phone]   [facility] [place] [phone]   [facility] [place]  [place], [place], [place] [phone]   [facility]/Dr. Osei-Bonsu  [address], [place] or [address], [place] [phone] Phone number for both [place] and [place] locations is the same.  [facility] [address] [place] [phone]   [facility] [address] [place] or [address] [phone]   [facility] [address], [place] [phone], phone; [phone], fax Sees patients 1st and 3rd Saturday of every month.  Must not qualify for public or private insurance (i.e. Medicaid, Medicare, [place] Health Choice, Veterans' Benefits)  Household income should be no more than 200% of the poverty level The clinic cannot treat you if you are pregnant or think you are pregnant  Sexually transmitted diseases are not treated at the clinic.    Dental Care: Organization         Address  Phone  Notes  [facility] Department of [facility] [phone] Accepts children up to age 25 who are enrolled in [place] or [place]; pregnant women with a Medicaid card; and children who have applied for Medicaid  or [place] Health Choice, but were declined, whose parents can pay a reduced fee at time of service.  [facility] Department of [facility]  [address], [place] [phone] Accepts children up to age 71 who are enrolled in [place] or [place]; pregnant women with a Medicaid card; and children who have applied for Medicaid or [place] Health Choice, but were declined, whose parents can pay a reduced fee at time of service.  [place] [facility]  [place] [phone] Patients are seen by appointment only. Walk-ins are not accepted. [place] will see patients 52 years of age and older. Monday - Tuesday (8am-5pm) Most Wednesdays (8:30-5pm) $30 per visit, cash only  [facility] Adult [facility] Access PRO[facility]  [address], [place] [phone] Patients are  seen by appointment only. Walk-ins are not accepted. [place] will see patients 30 years of age and older. One Wednesday Evening (Monthly: Volunteer Based).  $30 per visit, cash only  [place]  [phone] for adults; Children under age 61, call Graduate Pediatric Dentistry at [phone]. Children aged 81-14, please call [phone] to request a pediatric application.  Dental services are provided in all areas of dental care including fillings, crowns and bridges, complete and partial dentures, implants, gum treatment, root canals, and extractions. Preventive care is also provided. Treatment is provided to both adults and children. Patients are selected via a lottery and there is often a waiting list.   [facility] [address] [place]  ([phone] [email or web address]   [facility] [address] [place], [place] [phone], Ext. [phone] Second and Fourth Thursday of each month, opens at 6:30 AM; Clinic ends at 9 AM.  Patients are seen on a first-come first-served basis, and a limited number are seen  during each clinic.   [facility]  [address] Hillard Danker [place], [place] [phone]   Eligibility Requirements You must have lived in [place], [place], or [place] counties for at least the last three months.   You cannot be eligible for state or federal sponsored [employer], including [employer], [place], or [employer].   You generally cannot be eligible for healthcare insurance through your employer.    How to apply: Eligibility screenings are held every Tuesday and Wednesday afternoon from 1:00 pm until 4:00 pm. You do not need an appointment for the interview!  [facility] [address] [place], [place]   [place]  [place] Department  [place]  [phone]    Behavioral Health Resources in the Community: Intensive Outpatient Programs Organization         Address  Phone  Notes  [place]. [address], [place], [place] [phone]   [facility] [address], [place], [place]   [facility]: [facility] [address] [place], [place]   [place] [address]  [place], [place] or [phone]   Substance Abuse Resources Organization         Address  Phone  Notes  Al[facility] Drug Services  [phone]   [place]  [phone]   [facility] [place]   Chinita Pester  (660)[phone]   Residential & Outpatient Substance Abuse Program  (365)835-0330   Psychological Services Organization         Address  Phone  Notes  [facility]  [place]  [phone]   [place] [address] [place] or [phone]    Mobile Crisis Teams Organization         Address  Phone  Notes  [facility],  Mobile Crisis Care Unit  (740) [phone]   [facility]  [address] [place], [place]   [place] [address], [place] [phone]    Self-Help/Support Groups Organization         Address  Phone             Notes  [place]. of  - variety of support groups  [place] Call for  more information  Narcotics Anonymous (NA), [facility] [address], [employer] [place]  2 meetings at this location   Residential Facilities manager         Address  Phone  Notes  [facility] [place],    [place]  [phone]   [facility]  [address] [place] [phone], [place], [place]   Irena [place], [place] [phone] Admissions: 8am-3pm M-F  [facility]cent[facility] Substance [place] [address],    [place], [place] [phone]   [facility] [address] [place], [place], [place]   [facility] [address],  [place], [place]   [facility] - Intensive Outpatient [address] Kristeen [address], [place], [place]   [facility] [place].,  [place], [place] [phone] or [phone]   [facility] ([facility]) [address], [place], [place] Accepts Medicaid  [place] [address]  [place] [phone] Substance Abuse/Addiction Treatment   [facility] Organization         Address  Phone  Notes  [facility]  [phone]   Domenic Schwab, PhD [address] Arlis Porta [place], [place]   [phone] or [phone]   [place], [place] [phone]   [facility] [address] [place], [place] [phone] Insurance/Medicaid/sponsorship through [facility] and Families [address],  Ste [place]                                    [place], [place] [phone] [place] [address], [place] [phone]    Dr. Adele Schilder  [phone]   [facility]. 1) [address], [place] 2) [place] 3)  [place] [address], [place] [phone]  [phone]   [place] [phone] or [phone] (After Hours)

## 2014-07-27 ENCOUNTER — Ambulatory Visit (INDEPENDENT_AMBULATORY_CARE_PROVIDER_SITE_OTHER): Payer: Self-pay | Admitting: Emergency Medicine

## 2014-07-27 VITALS — BP 110/76 | HR 107 | Temp 98.4°F | Resp 18 | Ht 64.5 in | Wt 205.8 lb

## 2014-07-27 DIAGNOSIS — K029 Dental caries, unspecified: Secondary | ICD-10-CM

## 2014-07-27 MED ORDER — CEPHALEXIN 500 MG PO CAPS: 500.0000 mg | ORAL_CAPSULE | Freq: Four times a day (QID) | ORAL | Status: DC

## 2014-07-27 MED ORDER — HYDROCODONE-ACETAMINOPHEN 5-325 MG PO TABS: 1.0000 | ORAL_TABLET | ORAL | Status: DC | PRN

## 2014-07-27 NOTE — Patient Instructions (Signed)
Dental carDental Caries Dental caries (also called tooth decay) is the most common oral disease. It can occur at any age but is more common in children and young adults.  HOW DENTAL CARIES DEVELOPS  The process of decay begins when bacteria and foods (particularly sugars and starches) combine in your mouth to produce plaque. Plaque is a substance that sticks to the hard, outer surface of a tooth (enamel). The bacteria in plaque produce acids that attack enamel. These acids may also attack the root surface of a tooth (cementum) if it is exposed. Repeated attacks dissolve these surfaces and create holes in the tooth (cavities). If left untreated, the acids destroy the other layers of the tooth.  RISK FACTORS  Frequent sipping of sugary beverages.   Frequent snacking on sugary and starchy foods, especially those that easily get stuck in the teeth.   Poor oral hygiene.   Dry mouth.   Substance abuse such as methamphetamine abuse.   Broken or poor-fitting dental restorations.   Eating disorders.   Gastroesophageal reflux disease (GERD).   Certain radiation treatments to the head and neck. SYMPTOMS In the early stages of dental caries, symptoms are seldom present. Sometimes white, chalky areas may be seen on the enamel or other tooth layers. In later stages, symptoms may include:  Pits and holes on the enamel.  Toothache after sweet, hot, or cold foods or drinks are consumed.  Pain around the tooth.  Swelling around the tooth. DIAGNOSIS  Most of the time, dental caries is detected during a regular dental checkup. A diagnosis is made after a thorough medical and dental history is taken and the surfaces of your teeth are checked for signs of dental caries. Sometimes special instruments, such as lasers, are used to check for dental caries. Dental X-ray exams may be taken so that areas not visible to the eye (such as between the contact areas of the teeth) can be checked for cavities.   TREATMENT  If dental caries is in its early stages, it may be reversed with a fluoride treatment or an application of a remineralizing agent at the dental office. Thorough brushing and flossing at home is needed to aid these treatments. If it is in its later stages, treatment depends on the location and extent of tooth destruction:   If a small area of the tooth has been destroyed, the destroyed area will be removed and cavities will be filled with a material such as gold, silver amalgam, or composite resin.   If a large area of the tooth has been destroyed, the destroyed area will be removed and a cap (crown) will be fitted over the remaining tooth structure.   If the center part of the tooth (pulp) is affected, a procedure called a root canal will be needed before a filling or crown can be placed.   If most of the tooth has been destroyed, the tooth may need to be pulled (extracted). HOME CARE INSTRUCTIONS You can prevent, stop, or reverse dental caries at home by practicing good oral hygiene. Good oral hygiene includes:  Thoroughly cleaning your teeth at least twice a day with a toothbrush and dental floss.   Using a fluoride toothpaste. A fluoride mouth rinse may also be used if recommended by your dentist or health care provider.   Restricting the amount of sugary and starchy foods and sugary liquids you consume.   Avoiding frequent snacking on these foods and sipping of these liquids.   Keeping regular visits with  a dentist for checkups and cleanings. PREVENTION   Practice good oral hygiene.  Consider a dental sealant. A dental sealant is a coating material that is applied by your dentist to the pits and grooves of teeth. The sealant prevents food from being trapped in them. It may protect the teeth for several years.  Ask about fluoride supplements if you live in a community without fluorinated water or with water that has a low fluoride content. Use fluoride supplements  as directed by your dentist or health care provider.  Allow fluoride varnish applications to teeth if directed by your dentist or health care provider. Document Released: 11/29/2001 Document Revised: 07/24/2013 Document Reviewed: 03/11/2012 [facility] Patient Information 2015 [place], [place]. This information is not intended to replace advice given to you by your health care provider. Make sure you discuss any questions you have with your health care provider.

## 2014-07-27 NOTE — Progress Notes (Signed)
[facility] [address], [place] [address] [phone]  Date:  07/27/2014   Name:  Makayla Johnson   DOB:  1985-12-30   MRN:  [id number]  PCP:  No PCP Per Patient    Chief Complaint: Dental Pain   History of Present Illness:  Makayla Johnson is a 29 y.o. very pleasant female patient who presents with the following:  Seen in ER for dental abscess on left mandible. Was treated.  Now to office with recurrent pain. Has not seen a dentist No fever or chills No nausea or vomiting No stool change No improvement with over the counter medications or other home remedies.  Denies other complaint or health concern today.   There are no active problems to display for this patient.   Past Medical History  Diagnosis Date  . No pertinent past medical history   . Allergy     Past Surgical History  Procedure Laterality Date  . No past surgeries      History  Substance Use Topics  . Smoking status: Never Smoker   . Smokeless tobacco: Not on file  . Alcohol Use: No    Family History  Problem Relation Age of Onset  . Heart disease Father   . Hypertension Father   . Hypertension Brother   . Heart disease Brother     No Known Allergies  Medication list has been reviewed and updated.  Current Outpatient Prescriptions on File Prior to Visit  Medication Sig Dispense Refill  . HYDROcodone-acetaminophen (NORCO/VICODIN) 5-325 MG per tablet Take 1 tablet by mouth every 4 (four) hours as needed. (Patient not taking: Reported on 07/27/2014) 12 tablet 0  . Multiple Vitamin (MULTIVITAMIN WITH MINERALS) TABS Take 1 tablet by mouth every morning.     No current facility-administered medications on file prior to visit.    Review of Systems:  Review of Systems  Constitutional: Negative for fever, chills and fatigue.  HENT: Negative for congestion, ear pain, hearing loss, postnasal drip, rhinorrhea and sinus pressure.   Eyes: Negative for discharge and  redness.  Respiratory: Negative for cough, shortness of breath and wheezing.   Cardiovascular: Negative for chest pain and leg swelling.  Gastrointestinal: Negative for nausea, vomiting, abdominal pain, constipation and blood in stool.  Genitourinary: Negative for dysuria, urgency and frequency.  Musculoskeletal: Negative for neck stiffness.  Skin: Negative for rash.  Neurological: Negative for seizures, weakness and headaches.   Physical Examination: Filed Vitals:   07/27/14 1417  BP: 110/76  Pulse: 107  Temp: 98.4 F (36.9 C)  Resp: 18   Filed Vitals:   07/27/14 1417  Height: 5' 4.5" (1.638 m)  Weight: 205 lb 12.8 oz (93.35 kg)   Body mass index is 34.79 kg/(m^2). Ideal Body Weight: Weight in (lb) to have BMI = 25: 147.6  GEN: WDWN, NAD, Non-toxic, A & O x 3 HEENT: Atraumatic, Normocephalic. Neck supple. No masses, No LAD.  Dental caries with destruction #20 at gum line Ears and Nose: No external deformity. CV: RRR, No M/G/R. No JVD. No thrill. No extra heart sounds. PULM: CTA B, no wheezes, crackles, rhonchi. No retractions. No resp. distress. No accessory muscle use. ABD: S, NT, ND, +BS. No rebound. No HSM. EXTR: No c/c/e NEURO Normal gait.  PSYCH: Normally interactive. Conversant. Not depressed or anxious appearing.  Calm demeanor.    Assessment and Plan: Dental caries Dentist Keflex   Signed Phillips OdorJeffery Hazely Sealey, MD

## 2014-10-04 ENCOUNTER — Encounter (HOSPITAL_COMMUNITY): Payer: Self-pay | Admitting: Neurology

## 2014-10-04 ENCOUNTER — Emergency Department (HOSPITAL_COMMUNITY)
Admission: EM | Admit: 2014-10-04 | Discharge: 2014-10-04 | Disposition: A | Discharge status: Home / Self Care | Payer: Medicaid Other | Attending: Emergency Medicine | Admitting: Emergency Medicine

## 2014-10-04 DIAGNOSIS — Z79899 Other long term (current) drug therapy: Secondary | ICD-10-CM | POA: Insufficient documentation

## 2014-10-04 DIAGNOSIS — K029 Dental caries, unspecified: Secondary | ICD-10-CM | POA: Insufficient documentation

## 2014-10-04 DIAGNOSIS — K047 Periapical abscess without sinus: Secondary | ICD-10-CM | POA: Insufficient documentation

## 2014-10-04 DIAGNOSIS — K002 Abnormalities of size and form of teeth: Secondary | ICD-10-CM | POA: Insufficient documentation

## 2014-10-04 DIAGNOSIS — Z792 Long term (current) use of antibiotics: Secondary | ICD-10-CM | POA: Insufficient documentation

## 2014-10-04 MED ORDER — TRAMADOL HCL 50 MG PO TABS
50.0000 mg | ORAL_TABLET | Freq: Once | ORAL | Status: AC
  Administered 2014-10-04: 50 mg via ORAL
  Filled 2014-10-04: qty 1

## 2014-10-04 MED ORDER — AMOXICILLIN 500 MG PO CAPS: 500.0000 mg | ORAL_CAPSULE | Freq: Three times a day (TID) | ORAL | Status: DC

## 2014-10-04 MED ORDER — TRAMADOL HCL 50 MG PO TABS: 50.0000 mg | ORAL_TABLET | Freq: Four times a day (QID) | ORAL | Status: DC | PRN

## 2014-10-04 MED ORDER — AMOXICILLIN 500 MG PO CAPS
500.0000 mg | ORAL_CAPSULE | Freq: Once | ORAL | Status: AC
  Administered 2014-10-04: 500 mg via ORAL
  Filled 2014-10-04: qty 1

## 2014-10-04 NOTE — ED Notes (Signed)
Pt reports dental pain left upper and left lower teeth. Is waiting to see dentist.

## 2014-10-04 NOTE — ED Provider Notes (Signed)
CSN: [id number]     Arrival date & time 10/04/14  [id number] History   First MD Initiated Contact with Patient 10/04/14 0825     Chief Complaint  Patient presents with  . Dental Pain     (Consider location/radiation/quality/duration/timing/severity/associated sxs/prior Treatment) The history is provided by the patient.   Makayla Johnson Cristal Johnson is a 29 y.o. female presenting with a several day history of dental pain and gingival swelling.   The patient has a history of decay in the teeth involved which has recently started to cause increased  pain.  She reports having an abscess at the site 4 months ago and believes she is developing another one as there is increased swelling around her gum line.  There has been no fevers, chills, nausea or vomiting, also no complaint of difficulty swallowing, although chewing makes pain worse.  She endorses exquisite cold sensitivity.  The patient has tried ibuprofen, Tylenol and Orajel without relief of symptoms.  She did not currently have dental care, she has picked out a dentist, but is now waiting for her insurance to start..      Past Medical History  Diagnosis Date  . No pertinent past medical history   . Allergy    Past Surgical History  Procedure Laterality Date  . No past surgeries     Family History  Problem Relation Age of Onset  . Heart disease Father   . Hypertension Father   . Hypertension Brother   . Heart disease Brother    History  Substance Use Topics  . Smoking status: Never Smoker   . Smokeless tobacco: Not on file  . Alcohol Use: No   OB History    Gravida Para Term Preterm AB TAB SAB Ectopic Multiple Living   1    1  1         Review of Systems  Constitutional: Negative for fever.  HENT: Positive for dental problem. Negative for facial swelling and sore throat.   Respiratory: Negative for shortness of breath.   Musculoskeletal: Negative for neck pain and neck stiffness.      Allergies  Review of patient's allergies  indicates no known allergies.  Home Medications   Prior to Admission medications   Medication Sig Start Date End Date Taking? Authorizing Provider  amoxicillin (AMOXIL) 500 MG capsule Take 1 capsule (500 mg total) by mouth 3 (three) times daily. 10/04/14   Burgess AmorJulie Zaidy Absher, PA-C  cephALEXin (KEFLEX) 500 MG capsule Take 1 capsule (500 mg total) by mouth 4 (four) times daily. 07/27/14   Carmelina DaneJeffery S Anderson, MD  HYDROcodone-acetaminophen (NORCO) 5-325 MG per tablet Take 1-2 tablets by mouth every 4 (four) hours as needed. 07/27/14   Carmelina DaneJeffery S Anderson, MD  HYDROcodone-acetaminophen (NORCO/VICODIN) 5-325 MG per tablet Take 1 tablet by mouth every 4 (four) hours as needed. Patient not taking: Reported on 07/27/2014 04/15/14   Garlon HatchetLisa M Sanders, PA-C  Multiple Vitamin (MULTIVITAMIN WITH MINERALS) TABS Take 1 tablet by mouth every morning.    Historical Provider, MD  traMADol (ULTRAM) 50 MG tablet Take 1 tablet (50 mg total) by mouth every 6 (six) hours as needed for moderate pain. 10/04/14   Burgess AmorJulie Macdonald Rigor, PA-C   BP 140/73 mmHg  Pulse 103  Temp(Src) 98 F (36.7 C) (Oral)  Resp 18  Ht 5\' 4"  (1.626 m)  Wt 205 lb (92.987 kg)  BMI 35.17 kg/m2  SpO2 100%  LMP 09/04/2014 Physical Exam  Constitutional: She is oriented to person, place, and time. She appears  well-developed and well-nourished. No distress.  HENT:  Head: Normocephalic and atraumatic.  Right Ear: Tympanic membrane and external ear normal.  Left Ear: Tympanic membrane and external ear normal.  Mouth/Throat: Oropharynx is clear and moist and mucous membranes are normal. No oral lesions. No trismus in the jaw. Abnormal dentition. No uvula swelling.  Moderate to deep decay of her left lower molars.  There is a cavity in her left upper second molar as well.  There is surrounding gingival edema and erythema around the lower molars.  There is no active drainage.  No fluctuance or induration.  No facial swelling or erythema.  Eyes: Conjunctivae are normal.  Neck:  Normal range of motion. Neck supple.  Cardiovascular: Normal rate and normal heart sounds.   Pulmonary/Chest: Effort normal.  Abdominal: She exhibits no distension.  Musculoskeletal: Normal range of motion.  Lymphadenopathy:       Head (left side): Submandibular adenopathy present.    She has no cervical adenopathy.  Neurological: She is alert and oriented to person, place, and time.  Skin: Skin is warm and dry. No erythema.  Psychiatric: She has a normal mood and affect.    ED Course  Procedures (including critical care time) Labs Review Labs Reviewed - No data to display  Imaging Review No results found.   EKG Interpretation None      MDM   Final diagnoses:  Dental abscess    Patient with chronic dental decay, probable early abscess, patient placed on Amoxil and tramadol.  Discussed dental putty as a possible temporary solution for the cold sensitivity until she can be seen by dentistry.  The patient appears reasonably screened and/or stabilized for discharge and I doubt any other medical condition or other [facility] requiring further screening, evaluation, or treatment in the ED at this time prior to discharge.     Burgess Amor, PA-C 10/04/14 0900  Raeford Razor, MD 10/05/14 [phone]

## 2014-10-04 NOTE — Discharge Instructions (Signed)
Dental Abscess A dental abscess is a collection of infected fluid (pus) from a bacterial infection in the inner part of the tooth (pulp). It usually occurs at the end of the tooth's root.  CAUSES   Severe tooth decay.  Trauma to the tooth that allows bacteria to enter into the pulp, such as a broken or chipped tooth. SYMPTOMS   Severe pain in and around the infected tooth.  Swelling and redness around the abscessed tooth or in the mouth or face.  Tenderness.  Pus drainage.  Bad breath.  Bitter taste in the mouth.  Difficulty swallowing.  Difficulty opening the mouth.  Nausea.  Vomiting.  Chills.  Swollen neck glands. DIAGNOSIS   A medical and dental history will be taken.  An examination will be performed by tapping on the abscessed tooth.  X-rays may be taken of the tooth to identify the abscess. TREATMENT The goal of treatment is to eliminate the infection. You may be prescribed antibiotic medicine to stop the infection from spreading. A root canal may be performed to save the tooth. If the tooth cannot be saved, it may be pulled (extracted) and the abscess may be drained.  HOME CARE INSTRUCTIONS  Only take over-the-counter or prescription medicines for pain, fever, or discomfort as directed by your caregiver.  Rinse your mouth (gargle) often with salt water ( tsp salt in 8 oz [250 ml] of warm water) to relieve pain or swelling.  Do not drive after taking pain medicine (narcotics).  Do not apply heat to the outside of your face.  Return to your dentist for further treatment as directed. SEEK MEDICAL CARE IF:  Your pain is not helped by medicine.  Your pain is getting worse instead of better. SEEK IMMEDIATE MEDICAL CARE IF:  You have a fever or persistent symptoms for more than 2-3 days.  You have a fever and your symptoms suddenly get worse.  You have chills or a very bad headache.  You have problems breathing or swallowing.  You have trouble  opening your mouth.  You have swelling in the neck or around the eye. Document Released: 03/09/2005 Document Revised: 12/02/2011 Document Reviewed: 06/17/2010 [facility] Patient Information 2015 [place] [place]. This information is not intended to replace advice given to you by your health care provider. Make sure you discuss any questions you have with your health care provider.  Complete your entire course of antibiotics as prescribed.  You  may use the tramadol for pain relief but do not drive within 4 hours of taking as this will make you drowsy.  Avoid applying heat or ice to this abscess area which can worsen your symptoms.  You may use warm salt water swish and spit treatment or half peroxide and water swish and spit after meals to keep this area clean.  You may also want to look for dental putty at the pharmacy which can be a temporary solution and helped greatly with year cold sensitivity.

## 2015-04-26 ENCOUNTER — Encounter (HOSPITAL_COMMUNITY): Payer: Self-pay | Admitting: Emergency Medicine

## 2015-04-26 ENCOUNTER — Emergency Department (HOSPITAL_COMMUNITY)
Admission: EM | Admit: 2015-04-26 | Discharge: 2015-04-26 | Disposition: A | Discharge status: Home / Self Care | Payer: Medicaid Other | Attending: Emergency Medicine | Admitting: Emergency Medicine

## 2015-04-26 DIAGNOSIS — K047 Periapical abscess without sinus: Secondary | ICD-10-CM

## 2015-04-26 DIAGNOSIS — K029 Dental caries, unspecified: Secondary | ICD-10-CM | POA: Insufficient documentation

## 2015-04-26 DIAGNOSIS — K0381 Cracked tooth: Secondary | ICD-10-CM | POA: Insufficient documentation

## 2015-04-26 MED ORDER — HYDROCODONE-ACETAMINOPHEN 5-325 MG PO TABS
1.0000 | ORAL_TABLET | Freq: Once | ORAL | Status: AC
  Administered 2015-04-26: 1 via ORAL
  Filled 2015-04-26: qty 1

## 2015-04-26 MED ORDER — HYDROCODONE-ACETAMINOPHEN 5-325 MG PO TABS: 1.0000 | ORAL_TABLET | ORAL | Status: DC | PRN

## 2015-04-26 MED ORDER — CLINDAMYCIN HCL 150 MG PO CAPS
450.0000 mg | ORAL_CAPSULE | Freq: Three times a day (TID) | ORAL | Status: DC
Start: 2015-04-26 — End: 2016-10-30

## 2015-04-26 MED ORDER — LIDOCAINE-EPINEPHRINE 2 %-1:100000 IJ SOLN
INTRAMUSCULAR | Status: AC
  Filled 2015-04-26: qty 1

## 2015-04-26 NOTE — ED Notes (Signed)
Pt reports "cracked" bottom right tooth with new onset right facial swelling this morning.

## 2015-04-26 NOTE — ED Provider Notes (Addendum)
CSN: [id number]     Arrival date & time 04/26/15  0759 History   First MD Initiated Contact with Patient 04/26/15 [phone]     Chief Complaint  Patient presents with  . Oral Swelling  . Dental Pain     (Consider location/radiation/quality/duration/timing/severity/associated sxs/prior Treatment) HPI Comments: 30yo F who p/w dental pain and facial swelling. Pt reports a long hx of cracked bottom right tooth. Yesterday she began having facial swelling and pain near the tooth and today her symptoms have worsened. Her pain is severe and constant. She ran out of pain medication recently. No fevers or vomiting. No recent illness.  Patient is a 30 y.o. female presenting with tooth pain. The history is provided by the patient.  Dental Pain   Past Medical History  Diagnosis Date  . No pertinent past medical history   . Allergy    Past Surgical History  Procedure Laterality Date  . No past surgeries     Family History  Problem Relation Age of Onset  . Heart disease Father   . Hypertension Father   . Hypertension Brother   . Heart disease Brother    Social History  Substance Use Topics  . Smoking status: Never Smoker   . Smokeless tobacco: None  . Alcohol Use: No   OB History    Gravida Para Term Preterm AB TAB SAB Ectopic Multiple Living   Review of Systems  10 Systems reviewed and are negative for acute change except as noted in the HPI.   Allergies  Review of patient's allergies indicates no known allergies.  Home Medications   Prior to Admission medications   Medication Sig Start Date End Date Taking? Authorizing Provider  clindamycin (CLEOCIN) 150 MG capsule Take 3 capsules (450 mg total) by mouth 3 (three) times daily. For 10 days 04/26/15   Laurence Spates, MD  HYDROcodone-acetaminophen (NORCO/VICODIN) 5-325 MG tablet Take 1-2 tablets by mouth every 4 (four) hours as needed for severe pain. 04/26/15   Ambrose Finland Little, MD   BP 128/77 mmHg  Pulse  111  Temp(Src) 99.4 F (37.4 C) (Oral)  Resp 18  Ht  (1.626 m)  Wt 190 lb (86.183 kg)  BMI 32.60 kg/m2  SpO2 100%  LMP 03/23/2015 Physical Exam  Constitutional: She is oriented to person, place, and time. She appears well-developed and well-nourished. No distress.  HENT:  Head: Normocephalic and atraumatic.    Mouth/Throat: Oropharynx is clear and moist.    Swelling along R mandible w/ TTP, no skin changes; multiple broken molars w/ swelling along R lower buccal mucosa  Eyes: Conjunctivae are normal. Pupils are equal, round, and reactive to light.  Pulmonary/Chest: Effort normal.  Neurological: She is alert and oriented to person, place, and time.  Skin: Skin is warm and dry. No rash noted.  Psychiatric: She has a normal mood and affect. Judgment normal.  Nursing note and vitals reviewed.   ED Course  .Marland KitchenIncision and Drainage Date/Time: 04/26/2015 10:17 AM Performed by: Laurence Spates Authorized by: Laurence Spates Consent: Verbal consent obtained. Consent given by: patient Patient identity confirmed: verbally with patient Type: abscess Body area: mouth Location details: floor of mouth Anesthesia: local infiltration Local anesthetic: lidocaine 2% with epinephrine and topical anesthetic Anesthetic total: 1.5 ml Patient sedated: no Scalpel size: 11 Incision depth: dermal Complexity: simple Drainage: bloody Drainage amount: scant Wound treatment: wound left open Packing material:  1/2 in gauze Patient tolerance: Patient tolerated the procedure well with no immediate complications   (including critical care time) Labs Review Labs Reviewed - No data to display  Medications  lidocaine-EPINEPHrine (XYLOCAINE W/EPI) 2 %-1:100000 (with pres) injection (not administered)  HYDROcodone-acetaminophen (NORCO/VICODIN) 5-325 MG per tablet 1 tablet (1 tablet Oral Given 04/26/15 0930)     MDM   Final diagnoses:  Dental abscess   Pt w/ tooth pain and R  facial swelling that began yesterday. On exam, she had multiple dental caries and one tooth that was broken to the gumline with adjacent mucosal swelling. Exam consistent with dental abscess. Performed incision and drainage but I was unable to extract pocket of pus. Patient has dental insurance and I showed her online how to locate dental provider. Gave perception for clindamycin and a short course of pain control given the degree of swelling. She has no signs of systemic illness, no respiratory compromise, and no floor of mouth swelling to suggest Ludwig's. No comorbidities such as diabetes. I emphasized the importance of follow-up with dentist in a few days and extensively reviewed return precautions including any worsening symptoms. Patient was discharged in satisfactory condition.   Laurence Spates, MD 04/26/15 1020  Laurence Spates, MD 04/26/15 1020

## 2015-04-26 NOTE — Discharge Instructions (Signed)

## 2015-04-26 NOTE — ED Notes (Signed)
Little at bedside. 

## 2015-07-23 ENCOUNTER — Ambulatory Visit: Payer: Medicaid Other | Attending: Internal Medicine

## 2016-10-30 ENCOUNTER — Ambulatory Visit (INDEPENDENT_AMBULATORY_CARE_PROVIDER_SITE_OTHER): Payer: PRIVATE HEALTH INSURANCE | Admitting: Physician Assistant

## 2016-10-30 ENCOUNTER — Encounter: Payer: Self-pay | Admitting: Physician Assistant

## 2016-10-30 VITALS — BP 138/82 | HR 103 | Temp 98.4°F | Resp 18 | Ht 63.75 in | Wt 178.2 lb

## 2016-10-30 DIAGNOSIS — H60392 Other infective otitis externa, left ear: Secondary | ICD-10-CM | POA: Diagnosis not present

## 2016-10-30 MED ORDER — OFLOXACIN 0.3 % OT SOLN: 10.0000 [drp] | Freq: Every day | OTIC | 0 refills | Status: DC

## 2016-10-30 NOTE — Patient Instructions (Addendum)
  We have put you on an ear drop to use in your left ear.  For the ear fullness please use an OTC allergy medication and you can also add sudafed for any nasal congestion that you might have.   IF you received an x-ray today, you will receive an invoice from [facility]. Please contact [facility] at [phone] with questions or concerns regarding your invoice.   IF you received labwork today, you will receive an invoice from [facility]. Please contact [facility] at [phone] with questions or concerns regarding your invoice.   Our billing staff will not be able to assist you with questions regarding bills from these companies.  You will be contacted with the lab results as soon as they are available. The fastest way to get your results is to activate your My Chart account. Instructions are located on the last page of this paperwork. If you have not heard from us regarding the results in 2 weeks, please contact this office.

## 2016-10-30 NOTE — Progress Notes (Signed)
Makayla Johnson  MRN: [id number] DOB: 05-Sep-1985  PCP: Patient, No Pcp Per  Chief Complaint  Patient presents with  . Ear Pain    x 1 week. Swollen ear drum   . Tinnitus    Subjective:  Pt presents to clinic for ear pain.  Patient has been having left ear pain for 1 week. Patient flew to [place] for vacation the end of July and went swimming there. While she was there, she noticed her left ear started to drain clear white fluid. She denies blood or pus in drainage. She tried hydrogen peroxide and did not find relief. Taking 1500mg  tylenol every 4-5 hours for the pain helps. She reports it is painful to the touch, has muffled hearing, ringing in her ears, and itching.   Right ear - reports muffled hearing. No drainage, just sore to the touch.  History is obtained by patient.  Review of Systems  Constitutional: Negative for fever.  HENT: Positive for congestion and sinus pressure. Negative for sinus pain, sneezing and sore throat.   Respiratory: Negative.   Allergic/Immunologic: Negative for environmental allergies.    There are no active problems to display for this patient.   Current Outpatient Prescriptions on File Prior to Visit  Medication Sig Dispense Refill  . clindamycin (CLEOCIN) 150 MG capsule Take 3 capsules (450 mg total) by mouth 3 (three) times daily. For 10 days 90 capsule 0  . HYDROcodone-acetaminophen (NORCO/VICODIN) 5-325 MG tablet Take 1-2 tablets by mouth every 4 (four) hours as needed for severe pain. 10 tablet 0   No current facility-administered medications on file prior to visit.     No Known Allergies  Past Medical History:  Diagnosis Date  . Allergy   . No pertinent past medical history    Social History   Social History Narrative  . No narrative on file   Social History  Substance Use Topics  . Smoking status: Never Smoker  . Smokeless tobacco: Never Used  . Alcohol use No   family history includes Heart disease in her brother  and father; Hypertension in her brother and father.     Objective:  BP 135/90   Pulse (!) 103   Temp 98.4 F (36.9 C) (Oral)   Resp 18   Ht 5' 3.75" (1.619 m)   Wt 178 lb 3.2 oz (80.8 kg)   LMP 10/30/2016   SpO2 98%   BMI 30.83 kg/m  Body mass index is 30.83 kg/m.  Physical Exam  Constitutional: She is oriented to person, place, and time and well-developed, well-nourished, and in no distress.  HENT:  Head: Normocephalic and atraumatic.  Right Ear: Hearing, tympanic membrane, external ear and ear canal normal. No swelling or tenderness. No mastoid tenderness.  Left Ear: Hearing, tympanic membrane and ear canal normal. There is swelling and tenderness. No mastoid tenderness.  Nose: Nose normal. Right sinus exhibits no maxillary sinus tenderness and no frontal sinus tenderness. Left sinus exhibits no maxillary sinus tenderness and no frontal sinus tenderness.  Mouth/Throat: Uvula is midline, oropharynx is clear and moist and mucous membranes are normal.  Left ear: positive tug test of auricle and tragus. External canal with scaling and edema. Unable to visualize TM.  Eyes: Conjunctivae are normal.  Neck: Trachea normal and normal range of motion.  Cardiovascular: Normal rate, regular rhythm and normal heart sounds.   No murmur heard. Pulmonary/Chest: Effort normal and breath sounds normal.  Lymphadenopathy:       Head (right side): No submental,  no submandibular, no tonsillar, no preauricular, no posterior auricular and no occipital adenopathy present.       Head (left side): No submental, no submandibular, no tonsillar, no preauricular, no posterior auricular and no occipital adenopathy present.    She has no cervical adenopathy.       Right cervical: No superficial cervical adenopathy present.      Left cervical: No superficial cervical adenopathy present.  Neurological: She is alert and oriented to person, place, and time. Gait normal.  Skin: Skin is warm, dry and intact.    Psychiatric: Mood, memory, affect and judgment normal.  Vitals reviewed.   Assessment and Plan :  Infective otitis externa of left ear - Plan: ofloxacin (FLOXIN OTIC) 0.3 % OTIC solution -Place 10 drops in your left ear once a day. -For ear fullness use an OTC allergy medication. Can also add sudafed for nasal congestion. -For pain use 600-800mg  ibuprofen or 1000mg  tylenol.  Benny LennertSarah Kasidi Shanker PA-C  [facility] 10/30/2016 7:29 PM

## 2020-04-04 ENCOUNTER — Other Ambulatory Visit: Payer: Self-pay

## 2020-04-04 DIAGNOSIS — Z20822 Contact with and (suspected) exposure to covid-19: Secondary | ICD-10-CM

## 2020-04-06 LAB — NOVEL CORONAVIRUS, NAA: SARS-CoV-2, NAA: NOT DETECTED

## 2020-04-06 LAB — SARS-COV-2, NAA 2 DAY TAT

## 2023-06-25 ENCOUNTER — Encounter (HOSPITAL_COMMUNITY): Payer: Self-pay

## 2023-06-25 ENCOUNTER — Ambulatory Visit (HOSPITAL_COMMUNITY)
Admission: EM | Admit: 2023-06-25 | Discharge: 2023-06-25 | Disposition: A | Discharge status: Home / Self Care | Attending: Emergency Medicine | Admitting: Emergency Medicine

## 2023-06-25 DIAGNOSIS — K0889 Other specified disorders of teeth and supporting structures: Secondary | ICD-10-CM

## 2023-06-25 DIAGNOSIS — K029 Dental caries, unspecified: Secondary | ICD-10-CM

## 2023-06-25 MED ORDER — AMOXICILLIN-POT CLAVULANATE 875-125 MG PO TABS: 1.0000 | ORAL_TABLET | Freq: Two times a day (BID) | ORAL | 0 refills | Status: AC

## 2023-06-25 NOTE — Discharge Instructions (Addendum)
 Take the Augmentin twice daily with food until finished. Rinse with saline water after meals. Alternate between Tylenol 500 mg and 800 mg ibuprofen every 4-6 hours for pain. Follow-up with your dental team as scheduled.  Return to clinic for any new urgent symptoms.

## 2023-06-25 NOTE — ED Triage Notes (Signed)
 Patient presenting with dental pain, facial tightness and swelling onset yesterday. States last night a white bump popped up on the right lower side.   Prescriptions or OTC medications tried: No

## 2023-06-25 NOTE — ED Provider Notes (Signed)
 [facility]    CSN: [id number] Arrival date & time: 06/25/23  0805      History   Chief Complaint Chief Complaint  Patient presents with   Dental Pain    HPI Makayla Johnson is a 38 y.o. female.   Patient presents to clinic over concern of right lower dental pain around her molars.  She noticed that her face was a little tighter yesterday evening and that she had an abscess pop up on the right lower side this morning.  Does have significant erosion of all of her lower molars to the gumline. Denies fevers.   Took a Tylenol this morning and her pain is minimal.  Recently got dental insurance and has an appointment with her dentist at the end of the month to evaluate the extent of her erosion and what teeth need to be removed.  The history is provided by the patient and medical records.  Dental Pain   Past Medical History:  Diagnosis Date   Allergy    No pertinent past medical history     There are no active problems to display for this patient.   Past Surgical History:  Procedure Laterality Date   NO PAST SURGERIES      OB History     Gravida  1   Para      Term      Preterm      AB  1   Living         SAB  1   IAB      Ectopic      Multiple      Live Births               Home Medications    Prior to Admission medications   Medication Sig Start Date End Date Taking? Authorizing Provider  amoxicillin-clavulanate (AUGMENTIN) 875-125 MG tablet Take 1 tablet by mouth every 12 (twelve) hours. 06/25/23  Yes Rinaldo Ratel, [place] N, FNP  ofloxacin (FLOXIN OTIC) 0.3 % OTIC solution Place 10 drops into the left ear daily. 10/30/16   Valarie Cones, Dema Severin, PA-C    Family History Family History  Problem Relation Age of Onset   Heart disease Father    Hypertension Father    Hypertension Brother    Heart disease Brother     Social History Social History   Tobacco Use   Smoking status: Never   Smokeless tobacco: Never  Vaping Use    Vaping status: Former  Substance Use Topics   Alcohol use: Not Currently   Drug use: Never     Allergies   Patient has no known allergies.   Review of Systems Review of Systems  Per HPI  Physical Exam Triage Vital Signs ED Triage Vitals  Encounter Vitals Group     BP 06/25/23 0825 133/84     Systolic BP Percentile --      Diastolic BP Percentile --      Pulse Rate 06/25/23 0825 80     Resp 06/25/23 0825 16     Temp 06/25/23 0825 98.7 F (37.1 C)     Temp Source 06/25/23 0825 Oral     SpO2 06/25/23 0825 99 %     Weight --      Height --      Head Circumference --      Peak Flow --      Pain Score 06/25/23 0824 0     Pain Loc --  Pain Education --      Exclude from Growth Chart --    No data found.  Updated Vital Signs BP 133/84 (BP Location: Left Arm)   Pulse 80   Temp 98.7 F (37.1 C) (Oral)   Resp 16   LMP 05/31/2023 (Approximate)   SpO2 99%   Visual Acuity Right Eye Distance:   Left Eye Distance:   Bilateral Distance:    Right Eye Near:   Left Eye Near:    Bilateral Near:     Physical Exam Vitals and nursing note reviewed.  Constitutional:      Appearance: Normal appearance.  HENT:     Head: Normocephalic and atraumatic.     Right Ear: External ear normal.     Left Ear: External ear normal.     Nose: Nose normal.     Mouth/Throat:     Mouth: Mucous membranes are moist.     Dentition: Abnormal dentition. Dental tenderness, dental caries and dental abscesses present.     Pharynx: Uvula midline.      Comments: Significant erosion to all molars of the lower jaw, swelling to right lower jaw. Eyes:     Conjunctiva/sclera: Conjunctivae normal.  Cardiovascular:     Rate and Rhythm: Normal rate.  Pulmonary:     Effort: Pulmonary effort is normal. No respiratory distress.  Skin:    General: Skin is warm and dry.  Neurological:     General: No focal deficit present.     Mental Status: She is alert.  Psychiatric:        Mood and Affect:  Mood normal.      UC Treatments / Results  Labs (all labs ordered are listed, but only abnormal results are displayed) Labs Reviewed - No data to display  EKG   Radiology No results found.  Procedures Procedures (including critical care time)  Medications Ordered in UC Medications - No data to display  Initial Impression / Assessment and Plan / UC Course  I have reviewed the triage vital signs and the nursing notes.  Pertinent labs & imaging results that were available during my care of the patient were reviewed by me and considered in my medical decision making (see chart for details).  Vitals and triage reviewed, patient is hemodynamically stable. Significant dental erosion to lower molars, slight right lower jaw swelling, potential for dental abscess, will cover with Augmentin. Pain management discussed. Afebrile.  Plan of care, follow-up care return precautions given, no questions at this time.  Dental follow-up encouraged.     Final Clinical Impressions(s) / UC Diagnoses   Final diagnoses:  Pain, dental  Dental caries     Discharge Instructions      Take the Augmentin twice daily with food until finished. Rinse with saline water after meals. Alternate between Tylenol 500 mg and 800 mg ibuprofen every 4-6 hours for pain. Follow-up with your dental team as scheduled.  Return to clinic for any new urgent symptoms.    ED Prescriptions     Medication Sig Dispense Auth. Provider   amoxicillin-clavulanate (AUGMENTIN) 875-125 MG tablet Take 1 tablet by mouth every 12 (twelve) hours. 14 tablet Nance Mccombs, [place] N, [place]      PDMP not reviewed this encounter.   Rinaldo Ratel [place] N, [place] 06/25/23 [phone]

## 2023-12-25 ENCOUNTER — Ambulatory Visit (HOSPITAL_COMMUNITY)
Admission: EM | Admit: 2023-12-25 | Discharge: 2023-12-25 | Disposition: A | Discharge status: Home / Self Care | Payer: Self-pay

## 2023-12-25 ENCOUNTER — Encounter (HOSPITAL_COMMUNITY): Payer: Self-pay

## 2023-12-25 DIAGNOSIS — H60502 Unspecified acute noninfective otitis externa, left ear: Secondary | ICD-10-CM

## 2023-12-25 MED ORDER — CIPROFLOXACIN-DEXAMETHASONE 0.3-0.1 % OT SUSP: 4.0000 [drp] | Freq: Two times a day (BID) | OTIC | 0 refills | Status: AC

## 2023-12-25 NOTE — ED Triage Notes (Signed)
 Patient reports that she has had left ear pain and swelling of the face near the left ear since yesterday.  Patient states she has taken allergy medication and Tylenol  for pain.

## 2023-12-25 NOTE — ED Provider Notes (Signed)
 [facility]    CSN: [id number] Arrival date & time: 12/25/23  1511      History   Chief Complaint Chief Complaint  Patient presents with   Facial Swelling   Otalgia    HPI Makayla Johnson is a 38 y.o. female.   Patient presents today due to left ear pain since yesterday.  Patient states that she often gets ear infections due to washing her hair frequently.  Patient states that she attempted to use allergy medicine and Tylenol  for symptoms without relief.  Patient admits to otorrhea of left ear.   Otalgia   Past Medical History:  Diagnosis Date   Allergy    No pertinent past medical history     There are no active problems to display for this patient.   Past Surgical History:  Procedure Laterality Date   NO PAST SURGERIES      OB History     Gravida  1   Para      Term      Preterm      AB  1   Living         SAB  1   IAB      Ectopic      Multiple      Live Births               Home Medications    Prior to Admission medications   Medication Sig Start Date End Date Taking? Authorizing Provider  ciprofloxacin-dexamethasone (CIPRODEX) OTIC suspension Place 4 drops into the left ear 2 (two) times daily for 7 days. 12/25/23 01/01/24 Yes Andra Krabbe C, PA-C  amoxicillin -clavulanate (AUGMENTIN ) 875-125 MG tablet Take 1 tablet by mouth every 12 (twelve) hours. Patient not taking: Reported on 12/25/2023 06/25/23   Dreama Dea SAILOR, FNP    Family History Family History  Problem Relation Age of Onset   Heart disease Father    Hypertension Father    Hypertension Brother    Heart disease Brother     Social History Social History   Tobacco Use   Smoking status: Never   Smokeless tobacco: Never  Vaping Use   Vaping status: Former  Substance Use Topics   Alcohol use: Not Currently   Drug use: Never     Allergies   Patient has no known allergies.   Review of Systems Review of Systems  HENT:  Positive  for ear pain.      Physical Exam Triage Vital Signs ED Triage Vitals  Encounter Vitals Group     BP 12/25/23 1550 121/75     Girls Systolic BP Percentile --      Girls Diastolic BP Percentile --      Boys Systolic BP Percentile --      Boys Diastolic BP Percentile --      Pulse Rate 12/25/23 1550 85     Resp 12/25/23 1550 16     Temp 12/25/23 1550 98.2 F (36.8 C)     Temp Source 12/25/23 1550 Oral     SpO2 12/25/23 1550 98 %     Weight --      Height --      Head Circumference --      Peak Flow --      Pain Score 12/25/23 1549 6     Pain Loc --      Pain Education --      Exclude from Growth Chart --    No data found.  Updated Vital Signs BP 121/75 (BP Location: Left Arm)   Pulse 85   Temp 98.2 F (36.8 C) (Oral)   Resp 16   LMP 12/03/2023   SpO2 98%   Visual Acuity Right Eye Distance:   Left Eye Distance:   Bilateral Distance:    Right Eye Near:   Left Eye Near:    Bilateral Near:     Physical Exam Vitals and nursing note reviewed.  Constitutional:      General: She is not in acute distress.    Appearance: Normal appearance. She is not ill-appearing, toxic-appearing or diaphoretic.  HENT:     Right Ear: Hearing, tympanic membrane, ear canal and external ear normal.     Left Ear: Drainage, swelling and tenderness present.     Ears:     Comments: Tenderness to palpation of left tragus, tenderness to manipulation of left pinna, otorrhea noted in left ear canal, edema of left ear canal noted as well Eyes:     General: No scleral icterus. Cardiovascular:     Rate and Rhythm: Normal rate and regular rhythm.     Heart sounds: Normal heart sounds.  Pulmonary:     Effort: Pulmonary effort is normal. No respiratory distress.     Breath sounds: Normal breath sounds. No wheezing or rhonchi.  Skin:    General: Skin is warm.  Neurological:     Mental Status: She is alert and oriented to person, place, and time.  Psychiatric:        Mood and Affect: Mood  normal.        Behavior: Behavior normal.      UC Treatments / Results  Labs (all labs ordered are listed, but only abnormal results are displayed) Labs Reviewed - No data to display  EKG   Radiology No results found.  Procedures Procedures (including critical care time)  Medications Ordered in UC Medications - No data to display  Initial Impression / Assessment and Plan / UC Course  I have reviewed the triage vital signs and the nursing notes.  Pertinent labs & imaging results that were available during my care of the patient were reviewed by me and considered in my medical decision making (see chart for details).     Otitis externa of the left ear-patient was prescribed Ciprodex twice daily for 7 days and advised to keep ear clean and dry for 7 days.  Patient was also told about OTC preparations of acetic acid and isopropyl alcohol to reacidify the ear after getting water in the ear to help prevent future episodes of otitis externa.  Final diagnoses:  Acute otitis externa of left ear, unspecified type     Discharge Instructions      Keep ear clean and dry for 7 days.    ED Prescriptions     Medication Sig Dispense Auth. Provider   ciprofloxacin-dexamethasone (CIPRODEX) OTIC suspension Place 4 drops into the left ear 2 (two) times daily for 7 days. 2.8 mL Andra Corean BROCKS, PA-C      PDMP not reviewed this encounter.   Andra Corean BROCKS, PA-C 12/25/23 [phone]

## 2023-12-25 NOTE — Discharge Instructions (Addendum)
 Keep ear clean and dry for 7 days.
